# Patient Record
Sex: Male | Born: 1960 | Race: White | Hispanic: No | Smoking: Current every day smoker
Health system: Southern US, Community
[De-identification: ages and names within clinical notes are randomized; demographics above are authoritative.]

## PROBLEM LIST (undated history)

## (undated) DIAGNOSIS — M19019 Primary osteoarthritis, unspecified shoulder: Secondary | ICD-10-CM

## (undated) DIAGNOSIS — M87059 Idiopathic aseptic necrosis of unspecified femur: Secondary | ICD-10-CM

## (undated) DIAGNOSIS — E7800 Pure hypercholesterolemia, unspecified: Secondary | ICD-10-CM

## (undated) DIAGNOSIS — F17200 Nicotine dependence, unspecified, uncomplicated: Secondary | ICD-10-CM

## (undated) DIAGNOSIS — R42 Dizziness and giddiness: Secondary | ICD-10-CM

## (undated) DIAGNOSIS — J189 Pneumonia, unspecified organism: Secondary | ICD-10-CM

## (undated) DIAGNOSIS — S92309A Fracture of unspecified metatarsal bone(s), unspecified foot, initial encounter for closed fracture: Secondary | ICD-10-CM

## (undated) DIAGNOSIS — R972 Elevated prostate specific antigen [PSA]: Secondary | ICD-10-CM

## (undated) HISTORY — DX: Idiopathic aseptic necrosis of unspecified femur: M87.059

## (undated) HISTORY — PX: APPENDECTOMY: SHX54

## (undated) HISTORY — DX: Elevated prostate specific antigen (PSA): R97.20

## (undated) HISTORY — DX: Nicotine dependence, unspecified, uncomplicated: F17.200

## (undated) HISTORY — DX: Pneumonia, unspecified organism: J18.9

## (undated) HISTORY — PX: COLONOSCOPY: SHX174

## (undated) HISTORY — DX: Fracture of unspecified metatarsal bone(s), unspecified foot, initial encounter for closed fracture: S92.309A

## (undated) HISTORY — DX: Pure hypercholesterolemia, unspecified: E78.00

## (undated) HISTORY — PX: CARDIAC CATHETERIZATION: SHX172

## (undated) HISTORY — DX: Primary osteoarthritis, unspecified shoulder: M19.019

## (undated) HISTORY — DX: Dizziness and giddiness: R42

---

## 1999-05-10 ENCOUNTER — Encounter: Payer: Self-pay | Admitting: Emergency Medicine

## 1999-05-10 ENCOUNTER — Emergency Department (HOSPITAL_COMMUNITY): Admission: EM | Admit: 1999-05-10 | Discharge: 1999-05-10 | Payer: Self-pay | Admitting: Emergency Medicine

## 2012-06-06 ENCOUNTER — Ambulatory Visit: Payer: Self-pay | Admitting: Family Medicine

## 2012-06-06 VITALS — BP 112/74 | HR 82 | Temp 97.8°F | Resp 16 | Ht 69.5 in | Wt 202.0 lb

## 2012-06-06 DIAGNOSIS — M79673 Pain in unspecified foot: Secondary | ICD-10-CM

## 2012-06-06 DIAGNOSIS — Z0289 Encounter for other administrative examinations: Secondary | ICD-10-CM

## 2012-06-06 DIAGNOSIS — Z0000 Encounter for general adult medical examination without abnormal findings: Secondary | ICD-10-CM

## 2012-06-06 NOTE — Progress Notes (Signed)
[facility] [address], [place] [address] [phone]  Date:  06/06/2012   Name:  Roger Carrillo   DOB:  Jun 05, 1961   MRN:  [id number]  PCP:  No primary provider on file.    Chief Complaint: Employment Physical and Foot Pain   History of Present Illness:  Roger Carrillo is a 51 y.o. very pleasant male patient who presents with the following:  Here for a DOT exam, and also has noted some foot pain in the sides of both heels for the last couple of weeks.  He thinks this is due to overuse as he is currently moving.  He has no known health problems otherwise except for smoking  There is no problem list on file for this patient.   No past medical history on file.  No past surgical history on file.  History  Substance Use Topics  . Smoking status: Current Everyday Smoker -- 0.8 packs/day for 38 years    Types: Cigarettes  . Smokeless tobacco: Not on file  . Alcohol Use: Not on file    No family history on file.  No Known Allergies  Medication list has been reviewed and updated.  No current outpatient prescriptions on file prior to visit.    Review of Systems:  As per HPI- otherwise negative.   Physical Examination: Filed Vitals:   06/06/12 0810  BP: 112/74  Pulse: 82  Temp: 97.8 F (36.6 C)  Resp: 16   Filed Vitals:   06/06/12 0810  Height: 5' 9.5" (1.765 m)  Weight: 202 lb (91.627 kg)   Body mass index is 29.40 kg/(m^2). Ideal Body Weight: Weight in (lb) to have BMI = 25: 171.4   GEN: WDWN, NAD, Non-toxic, A & O x 3 HEENT: Atraumatic, Normocephalic. Neck supple. No masses, No LAD.  TM and oropharynx wnl, PEERL, EOMI Ears and Nose: No external deformity. CV: RRR, No M/G/R. No JVD. No thrill. No extra heart sounds. PULM: CTA B, no wheezes, crackles, rhonchi. No retractions. No resp. distress. No accessory muscle use. ABD: S, NT, ND, +BS. No rebound. No HSM. EXTR: No c/c/e No inguinal hernia NEURO Normal gait. Normal strength  and DTR.   PSYCH: Normally interactive. Conversant. Not depressed or anxious appearing.  Calm demeanor.  He has mild tenderness in both heels- not over the achilles tendons, but medially and laterally. No swelling or bruise, normal flexion and extension of ankle   Assessment and Plan: 1. Foot pain   2. 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  Foot pain is non- specific.  Likely due to overuse- let me know if this does not get better soon   Abbe Amsterdam, MD

## 2013-04-30 LAB — HM COLONOSCOPY

## 2015-05-16 ENCOUNTER — Emergency Department (HOSPITAL_COMMUNITY): Payer: Self-pay

## 2015-05-16 ENCOUNTER — Emergency Department (HOSPITAL_COMMUNITY)
Admission: EM | Admit: 2015-05-16 | Discharge: 2015-05-16 | Disposition: A | Discharge status: Home / Self Care | Payer: Self-pay | Attending: Emergency Medicine | Admitting: Emergency Medicine

## 2015-05-16 DIAGNOSIS — Z720 Tobacco use: Secondary | ICD-10-CM | POA: Insufficient documentation

## 2015-05-16 DIAGNOSIS — J159 Unspecified bacterial pneumonia: Secondary | ICD-10-CM | POA: Insufficient documentation

## 2015-05-16 DIAGNOSIS — J189 Pneumonia, unspecified organism: Secondary | ICD-10-CM

## 2015-05-16 DIAGNOSIS — R1011 Right upper quadrant pain: Secondary | ICD-10-CM | POA: Insufficient documentation

## 2015-05-16 LAB — URINALYSIS, ROUTINE W REFLEX MICROSCOPIC
Bilirubin Urine: NEGATIVE
Glucose, UA: 100 mg/dL — AB
Hgb urine dipstick: NEGATIVE
Ketones, ur: NEGATIVE mg/dL
Leukocytes, UA: NEGATIVE
Nitrite: NEGATIVE
Protein, ur: NEGATIVE mg/dL
Specific Gravity, Urine: 1.015 (ref 1.005–1.030)
Urobilinogen, UA: 1 mg/dL (ref 0.0–1.0)
pH: 6 (ref 5.0–8.0)

## 2015-05-16 LAB — CBC WITH DIFFERENTIAL/PLATELET
Basophils Absolute: 0 10*3/uL (ref 0.0–0.1)
Basophils Relative: 0 % (ref 0–1)
Eosinophils Absolute: 0.1 10*3/uL (ref 0.0–0.7)
Eosinophils Relative: 1 % (ref 0–5)
HCT: 42 % (ref 39.0–52.0)
Hemoglobin: 14 g/dL (ref 13.0–17.0)
Lymphocytes Relative: 16 % (ref 12–46)
Lymphs Abs: 2.2 10*3/uL (ref 0.7–4.0)
MCH: 29.5 pg (ref 26.0–34.0)
MCHC: 33.3 g/dL (ref 30.0–36.0)
MCV: 88.4 fL (ref 78.0–100.0)
Monocytes Absolute: 1 10*3/uL (ref 0.1–1.0)
Monocytes Relative: 7 % (ref 3–12)
Neutro Abs: 10.1 10*3/uL — ABNORMAL HIGH (ref 1.7–7.7)
Neutrophils Relative %: 76 % (ref 43–77)
Platelets: 287 10*3/uL (ref 150–400)
RBC: 4.75 MIL/uL (ref 4.22–5.81)
RDW: 14.1 % (ref 11.5–15.5)
WBC: 13.3 10*3/uL — ABNORMAL HIGH (ref 4.0–10.5)

## 2015-05-16 LAB — I-STAT TROPONIN, ED: Troponin i, poc: 0 ng/mL (ref 0.00–0.08)

## 2015-05-16 LAB — COMPREHENSIVE METABOLIC PANEL
ALT: 19 U/L (ref 17–63)
AST: 29 U/L (ref 15–41)
Albumin: 3.9 g/dL (ref 3.5–5.0)
Alkaline Phosphatase: 68 U/L (ref 38–126)
Anion gap: 14 (ref 5–15)
BUN: 12 mg/dL (ref 6–20)
CO2: 19 mmol/L — ABNORMAL LOW (ref 22–32)
Calcium: 9.2 mg/dL (ref 8.9–10.3)
Chloride: 101 mmol/L (ref 101–111)
Creatinine, Ser: 1.35 mg/dL — ABNORMAL HIGH (ref 0.61–1.24)
GFR calc Af Amer: >60 mL/min (ref >60)
GFR calc non Af Amer: 58 mL/min — ABNORMAL LOW (ref >60)
Glucose, Bld: 89 mg/dL (ref 65–99)
Potassium: 4.1 mmol/L (ref 3.5–5.1)
Sodium: 134 mmol/L — ABNORMAL LOW (ref 135–145)
Total Bilirubin: 1.3 mg/dL — ABNORMAL HIGH (ref 0.3–1.2)
Total Protein: 7.7 g/dL (ref 6.5–8.1)

## 2015-05-16 LAB — CBG MONITORING, ED: Glucose-Capillary: 102 mg/dL — ABNORMAL HIGH (ref 65–99)

## 2015-05-16 MED ORDER — DOXYCYCLINE HYCLATE 100 MG PO TABS
100.0000 mg | ORAL_TABLET | Freq: Once | ORAL | Status: AC
  Administered 2015-05-16: 100 mg via ORAL
  Filled 2015-05-16: qty 1

## 2015-05-16 MED ORDER — DOXYCYCLINE HYCLATE 100 MG PO CAPS: 100.0000 mg | ORAL_CAPSULE | Freq: Two times a day (BID) | ORAL | Status: DC

## 2015-05-16 MED ORDER — SODIUM CHLORIDE 0.9 % IV BOLUS (SEPSIS)
1000.0000 mL | Freq: Once | INTRAVENOUS | Status: AC
  Administered 2015-05-16: 1000 mL via INTRAVENOUS

## 2015-05-16 MED ORDER — ACETAMINOPHEN 325 MG PO TABS
650.0000 mg | ORAL_TABLET | Freq: Once | ORAL | Status: AC
  Administered 2015-05-16: 650 mg via ORAL
  Filled 2015-05-16: qty 2

## 2015-05-16 NOTE — ED Notes (Signed)
Pt left with all belongings and wheeled out of treatment area.

## 2015-05-16 NOTE — Discharge Instructions (Signed)
Pneumonia, Adult  Pneumonia is an infection of the lungs. It may be caused by a germ (virus or bacteria). Some types of pneumonia can spread easily from person to person. This can happen when you cough or sneeze.  HOME CARE   Only take medicine as told by your doctor.   Take your medicine (antibiotics) as told. Finish it even if you start to feel better.   Do not smoke.   You may use a vaporizer or humidifier in your room. This can help loosen thick spit (mucus).   Sleep so you are almost sitting up (semi-upright). This helps reduce coughing.   Rest.  A shot (vaccine) can help prevent pneumonia. Shots are often advised for:   People over 65 years old.   Patients on chemotherapy.   People with long-term (chronic) lung problems.   People with immune system problems.  GET HELP RIGHT AWAY IF:    You are getting worse.   You cannot control your cough, and you are losing sleep.   You cough up blood.   Your pain gets worse, even with medicine.   You have a fever.   Any of your problems are getting worse, not better.   You have shortness of breath or chest pain.  MAKE SURE YOU:    Understand these instructions.   Will watch your condition.   Will get help right away if you are not doing well or get worse.  Document Released: 03/04/2008 Document Revised: 12/09/2011 Document Reviewed: 12/07/2010  ExitCare Patient Information 2015 [employer]. This information is not intended to replace advice given to you by your health care provider. Make sure you discuss any questions you have with your health care provider.

## 2015-05-16 NOTE — ED Notes (Signed)
Pt reports he left work early yesterday due to feeling weak. This morning he urinated orange colored urine. Back, legs, necks. Says been feeling very fatigued. Tick bite 3 weeks ago. Blurred vision and chest pain. States he just has no strength especially in legs.

## 2015-05-16 NOTE — ED Provider Notes (Signed)
History   Chief Complaint  Patient presents with  . Fatigue    HPI  Roger Carrillo is a 54 y.o. male with no reported PMH who presents to ED with c/o fatigue, subj f/c, arthralgia/myalgias, RUQ pain, prod cough, HA, rash.   Onset of symptoms: gradual. Duration 4 days.  Modifying factors none.  Severity: moderate.  Associated symptoms: as above.  Hx of similar symptoms: no.    Pt reports sxs prog worsening. Also had tick bite 3 months ago. Rash is red, not itchy/painful, looks like sunburn although not out in sun and is now improving. RUQ pain is achy and worse with palpation, rated mild; no h/o prior; has GB. Also has had 1 day of orange urine, no dysuria/freq/flank pain. Denies CP, SOB, N/V/D, dizziness, focal weakness.   Past medical/surgical history, social history, medications, allergies and FH have been reviewed with patient and/or in documentation. Furthermore, if pt family or friend(s) present, additional historical information was obtained from them.  No past medical history on file. No past surgical history on file. No family history on file. Social History  Substance Use Topics  . Smoking status: Current Every Day Smoker -- 0.80 packs/day for 38 years    Types: Cigarettes  . Smokeless tobacco: Not on file  . Alcohol Use: Not on file     Review of Systems Constitutional: + F/C, +fatigue.  HENT: - congestion, -rhinorrhea, -sore throat.   Eyes: - eye pain, -visual disturbance.  Respiratory: + cough, -SOB, -hemoptysis.   Cardiovascular: - CP, -palps.  Gastrointestinal: - N/V/D, +abd pain  Genitourinary: - flank pain, -dysuria, -frequency. + orange urine Musculoskeletal: + myalgia/arthritis, -joint swelling, -gait abnormality, -back pain  Skin: - rash/lesion.  Neurological: - focal weakness, -lightheadedness, -dizziness, -numbness, +HA.  All other systems reviewed and are negative.   Physical Exam  Physical Exam  ED Triage Vitals  Enc Vitals Group     BP  05/16/15 1256 115/82 mmHg     Pulse Rate 05/16/15 1256 106     Resp 05/16/15 1256 18     Temp 05/16/15 1256 99.1 F (37.3 C)     Temp Source 05/16/15 1256 Oral     SpO2 05/16/15 1256 95 %     Weight --      Height --      Head Cir --      Peak Flow --      Pain Score 05/16/15 1254 7     Pain Loc --      Pain Edu? --      Excl. in GC? --    Constitutional: Patient is well appearing and in no acute distress Head: Normocephalic and atraumatic.  Eyes: Extraocular motion intact, no scleral icterus Mouth: MMM, OP clear Neck: Supple without meningismus, mass, or overt JVD Respiratory: No respiratory distress. Normal WOB. No w/r/g. CV: tachycardic rate, no obvious murmurs.  Pulses +2 and symmetric. Euvolemic Abdomen: Soft, ND, no r/g. TTP in RUQ and + murphey sign. No mass.  MSK: Extremities are atraumatic without deformity, ROM intact Skin: Warm, dry, intact with fine erythematous macular rash to trunk without petechiae or purpura and appears like sunburn. No drainage/fluctuance/induration or TTP. Neuro: HDS, AAOx4. PERRL, EOMI, TML, face sym. CN 2-12 grossly intact. 5/5 sym, no drift, SILT, normal gait and coordination.   ED Course  Procedures   Labs Reviewed  CBC WITH DIFFERENTIAL/PLATELET - Abnormal; Notable for the following:    WBC 13.3 (*)    Neutro Abs  10.1 (*)    All other components within normal limits  COMPREHENSIVE METABOLIC PANEL - Abnormal; Notable for the following:    Sodium 134 (*)    CO2 19 (*)    Creatinine, Ser 1.35 (*)    Total Bilirubin 1.3 (*)    GFR calc non Af Amer 58 (*)    All other components within normal limits  URINALYSIS, ROUTINE W REFLEX MICROSCOPIC (NOT AT [facility]) - Abnormal; Notable for the following:    Glucose, UA 100 (*)    All other components within normal limits  CBG MONITORING, ED - Abnormal; Notable for the following:    Glucose-Capillary 102 (*)    All other components within normal limits  I-STAT TROPOININ, ED   I personally  reviewed and interpreted all labs.  Dg Chest 2 View  05/16/2015   CLINICAL DATA:  Four day history of cough, fever and generalized weakness. Tick bite approximately 3 months ago with persistence of the skin lesion on his leg.  EXAM: CHEST  2 VIEW  COMPARISON:  None.  FINDINGS: Cardiomediastinal silhouette unremarkable. Mildly prominent bronchovascular markings diffusely and mild central peribronchial thickening. Patchy and streaky opacities in the perihilar regions and the lower lobes bilaterally. No pleural effusions. No pneumothorax. Mild degenerative changes involving the thoracic spine.  IMPRESSION: Bronchopneumonia suspected in both lungs involving the upper and lower lobes.   Electronically Signed   By: Hulan Saas M.D.   On: 05/16/2015 17:43   US Abdomen Limited Ruq  05/16/2015   CLINICAL DATA:  Acute onset of right upper quadrant abdominal pain. Initial encounter.  EXAM: US ABDOMEN LIMITED - RIGHT UPPER QUADRANT  COMPARISON:  None.  FINDINGS: Gallbladder:  No gallstones or wall thickening visualized. No sonographic Murphy sign noted.  Common bile duct:  Diameter: 0.2 cm, within normal limits in caliber.  Liver:  No focal lesion identified. Diffusely increased parenchymal echogenicity likely reflects fatty infiltration.  IMPRESSION: 1. No acute abnormality seen at the right upper quadrant. 2. Diffuse fatty infiltration within the liver.   Electronically Signed   By: Roanna Raider M.D.   On: 05/16/2015 17:57   I personally viewed above image(s) which were used in my medical decision making. Formal interpretations by Radiology.   EKG Interpretation  Date/Time:  Tuesday May 16 2015 12:53:35 EDT Ventricular Rate:  101 PR Interval:  156 QRS Duration: 90 QT Interval:  326 QTC Calculation: 422 R Axis:   18 Text Interpretation:  Sinus tachycardia Otherwise normal ECG No old tracing to compare Confirmed by Mirian Mo [phone]) on 05/16/2015 4:51:01 PM       MDM: Roger Carrillo is a  54 y.o. male with H&P as above who p/w CC: fatigue + multiple other vague complaints as above.  On arrival, patient is hemodynamically stable and in no apparent distress. Patient's clinical picture raises concern for viral process versus tickborne illness. Patient does not appear septic. Patient additionally has a positive Murphy sign on exam as well as a rash as described above in the setting of multiple other vague symptoms. Given his recent tick bite and arthralgia, myalgia with rash, I have low threshold for starting patient on doxycycline. I have ordered a chest x-ray given his complaint of cough to rule out pneumonia. Ultrasound to evaluate gallbladder.  Workup was notable for slight hyponatremia with normal platelet count. Patient has a mild leukocytosis. EKG is sinus tach without signs of acute ischemia. Patient is given IV fluids as well as Tylenol for mild  headache. Patient has no meningeal signs on exam and has a benign neuro exam as above. No indication for LP at this time. RUQ scan unremarkable  CXR shows PNA. First dose ABX (doxy) given. Doxy was chosen as it will also cover for possible tick borne illness given rash and tick exposure. Pt remained stable in ED and is deemed stable for d/c.  Old records reviewed (if available). Labs and imaging reviewed personally by myself and considered in medical decision making if ordered. Clinical Impression: 1. CAP (community acquired pneumonia)   2. RUQ pain     Disposition: Discharge  Condition: Good  I have discussed the results, Dx and Tx plan with the pt(& family if present). He/she/they expressed understanding and agree(s) with the plan. Discharge instructions discussed at great length. Strict return precautions discussed and pt &/or family have verbalized understanding of the instructions. No further questions at time of discharge.    Discharge Medication List as of 05/16/2015  6:52 PM    START taking these medications   Details   doxycycline (VIBRAMYCIN) 100 MG capsule Take 1 capsule (100 mg total) by mouth 2 (two) times daily., Starting 05/16/2015, Until Discontinued, Print        Follow Up: [facility]     [address] [place] [address] [phone]  As needed  [facility] EMERGENCY DEPARTMENT [address] [id number] Wilhemina Bonito [place] [address] [phone]  If symptoms worsen   Pt seen in conjunction with Dr. Jomarie Longs, DO [facility] Emergency Medicine Resident - PGY-3     Ames Dura, MD 05/17/15 2536  Mirian Mo, MD 05/21/15 [phone]

## 2015-06-19 ENCOUNTER — Emergency Department (HOSPITAL_BASED_OUTPATIENT_CLINIC_OR_DEPARTMENT_OTHER): Payer: BLUE CROSS/BLUE SHIELD

## 2015-06-19 ENCOUNTER — Emergency Department (HOSPITAL_BASED_OUTPATIENT_CLINIC_OR_DEPARTMENT_OTHER)
Admission: EM | Admit: 2015-06-19 | Discharge: 2015-06-19 | Disposition: A | Discharge status: Home / Self Care | Payer: BLUE CROSS/BLUE SHIELD | Attending: Emergency Medicine | Admitting: Emergency Medicine

## 2015-06-19 ENCOUNTER — Encounter (HOSPITAL_BASED_OUTPATIENT_CLINIC_OR_DEPARTMENT_OTHER): Payer: Self-pay | Admitting: Emergency Medicine

## 2015-06-19 DIAGNOSIS — T07XXXA Unspecified multiple injuries, initial encounter: Secondary | ICD-10-CM

## 2015-06-19 DIAGNOSIS — S92351A Displaced fracture of fifth metatarsal bone, right foot, initial encounter for closed fracture: Secondary | ICD-10-CM | POA: Diagnosis not present

## 2015-06-19 DIAGNOSIS — S3991XA Unspecified injury of abdomen, initial encounter: Secondary | ICD-10-CM | POA: Insufficient documentation

## 2015-06-19 DIAGNOSIS — Y92410 Unspecified street and highway as the place of occurrence of the external cause: Secondary | ICD-10-CM | POA: Diagnosis not present

## 2015-06-19 DIAGNOSIS — Y998 Other external cause status: Secondary | ICD-10-CM | POA: Insufficient documentation

## 2015-06-19 DIAGNOSIS — Y9389 Activity, other specified: Secondary | ICD-10-CM | POA: Insufficient documentation

## 2015-06-19 DIAGNOSIS — S92321A Displaced fracture of second metatarsal bone, right foot, initial encounter for closed fracture: Secondary | ICD-10-CM | POA: Insufficient documentation

## 2015-06-19 DIAGNOSIS — Z720 Tobacco use: Secondary | ICD-10-CM | POA: Insufficient documentation

## 2015-06-19 DIAGNOSIS — S92301A Fracture of unspecified metatarsal bone(s), right foot, initial encounter for closed fracture: Secondary | ICD-10-CM

## 2015-06-19 DIAGNOSIS — Z23 Encounter for immunization: Secondary | ICD-10-CM | POA: Insufficient documentation

## 2015-06-19 DIAGNOSIS — S99921A Unspecified injury of right foot, initial encounter: Secondary | ICD-10-CM | POA: Diagnosis present

## 2015-06-19 DIAGNOSIS — S60221A Contusion of right hand, initial encounter: Secondary | ICD-10-CM | POA: Diagnosis not present

## 2015-06-19 DIAGNOSIS — Z792 Long term (current) use of antibiotics: Secondary | ICD-10-CM | POA: Insufficient documentation

## 2015-06-19 DIAGNOSIS — S9031XA Contusion of right foot, initial encounter: Secondary | ICD-10-CM | POA: Diagnosis not present

## 2015-06-19 MED ORDER — SILVER SULFADIAZINE 1 % EX CREA: 1.0000 "application " | TOPICAL_CREAM | Freq: Every day | CUTANEOUS | Status: DC

## 2015-06-19 MED ORDER — OXYCODONE-ACETAMINOPHEN 5-325 MG PO TABS
2.0000 | ORAL_TABLET | Freq: Once | ORAL | Status: AC
  Administered 2015-06-19: 2 via ORAL
  Filled 2015-06-19: qty 2

## 2015-06-19 MED ORDER — SILVER SULFADIAZINE 1 % EX CREA
TOPICAL_CREAM | Freq: Once | CUTANEOUS | Status: AC
  Administered 2015-06-19: 1 via TOPICAL
  Filled 2015-06-19: qty 85

## 2015-06-19 MED ORDER — HYDROCODONE-ACETAMINOPHEN 5-325 MG PO TABS: 1.0000 | ORAL_TABLET | ORAL | Status: DC | PRN

## 2015-06-19 MED ORDER — TETANUS-DIPHTH-ACELL PERTUSSIS 5-2.5-18.5 LF-MCG/0.5 IM SUSP
0.5000 mL | Freq: Once | INTRAMUSCULAR | Status: AC
  Administered 2015-06-19: 0.5 mL via INTRAMUSCULAR
  Filled 2015-06-19: qty 0.5

## 2015-06-19 NOTE — Discharge Instructions (Signed)
Abrasion  An abrasion is a cut or scrape of the skin. Abrasions do not extend through all layers of the skin and most heal within 10 days. It is important to care for your abrasion properly to prevent infection.  CAUSES   Most abrasions are caused by falling on, or gliding across, the ground or other surface. When your skin rubs on something, the outer and inner layer of skin rubs off, causing an abrasion.  DIAGNOSIS   Your caregiver will be able to diagnose an abrasion during a physical exam.   TREATMENT   Your treatment depends on how large and deep the abrasion is. Generally, your abrasion will be cleaned with water and a mild soap to remove any dirt or debris. An antibiotic ointment may be put over the abrasion to prevent an infection. A bandage (dressing) may be wrapped around the abrasion to keep it from getting dirty.   You may need a tetanus shot if:   You cannot remember when you had your last tetanus shot.   You have never had a tetanus shot.   The injury broke your skin.  If you get a tetanus shot, your arm may swell, get red, and feel warm to the touch. This is common and not a problem. If you need a tetanus shot and you choose not to have one, there is a rare chance of getting tetanus. Sickness from tetanus can be serious.   HOME CARE INSTRUCTIONS    If a dressing was applied, change it at least once a day or as directed by your caregiver. If the bandage sticks, soak it off with warm water.    Wash the area with water and a mild soap to remove all the ointment 2 times a day. Rinse off the soap and pat the area dry with a clean towel.    Reapply any ointment as directed by your caregiver. This will help prevent infection and keep the bandage from sticking. Use gauze over the wound and under the dressing to help keep the bandage from sticking.    Change your dressing right away if it becomes wet or dirty.    Only take over-the-counter or prescription medicines for pain, discomfort, or fever as  directed by your caregiver.    Follow up with your caregiver within 24-48 hours for a wound check, or as directed. If you were not given a wound-check appointment, look closely at your abrasion for redness, swelling, or pus. These are signs of infection.  SEEK IMMEDIATE MEDICAL CARE IF:    You have increasing pain in the wound.    You have redness, swelling, or tenderness around the wound.    You have pus coming from the wound.    You have a fever or persistent symptoms for more than 2-3 days.   You have a fever and your symptoms suddenly get worse.   You have a bad smell coming from the wound or dressing.   MAKE SURE YOU:    Understand these instructions.   Will watch your condition.   Will get help right away if you are not doing well or get worse.  Document Released: 06/26/2005 Document Revised: 09/02/2012 Document Reviewed: 08/20/2011  Exit[employer] Patient Information 2015 [employer]. This information is not intended to replace advice given to you by your health care provider. Make sure you discuss any questions you have with your health care provider.  Metatarsal Fracture, Undisplaced  A metatarsal fracture is a break in the bone(s)   there are problems in the forefoot and x-rays are normal a later bone scan will usually make the diagnosis.  TREATMENT AND HOME CARE INSTRUCTIONS  Treatment may or may not include a cast or walking shoe. When casts are needed the use is usually for short periods of time so as not to slow down healing with muscle wasting (atrophy).  Activities should be stopped until further advised by your caregiver.  Wear shoes with adequate shock absorbing capabilities and stiff soles.  Alternative exercise may be undertaken while waiting for healing. These may include bicycling and swimming, or as  your caregiver suggests.  It is important to keep all follow-up visits or specialty referrals. The failure to keep these appointments could result in improper bone healing and chronic pain or disability.  Warning: Do not drive a car or operate a motor vehicle until your caregiver specifically tells you it is safe to do so. IF YOU DO NOT HAVE A CAST OR SPLINT:  You may walk on your injured foot as tolerated or advised.  Do not put any weight on your injured foot for as long as directed by your caregiver. Slowly increase the amount of time you walk on the foot as the pain allows or as advised.  Use crutches until you can bear weight without pain. A gradual increase in weight bearing may help.  Apply ice to the injury for 15-20 minutes each hour while awake for the first 2 days. Put the ice in a plastic bag and place a towel between the bag of ice and your skin.  Only take over-the-counter or prescription medicines for pain, discomfort, or fever as directed by your caregiver. SEEK IMMEDIATE MEDICAL CARE IF:   Your cast gets damaged or breaks.  You have continued severe pain or more swelling than you did before the cast was put on, or the pain is not controlled with medications.  Your skin or nails below the injury turn blue or grey, or feel cold or numb.  There is a bad smell, or new stains or pus-like (purulent) drainage coming from the cast. MAKE SURE YOU:   Understand these instructions.  Will watch your condition.  Will get help right away if you are not doing well or get worse. Document Released: 06/08/2002 Document Revised: 12/09/2011 Document Reviewed: 04/29/2008 [facility] Patient Information 2015 [place], [place]. This information is not intended to replace advice given to you by your health care provider. Make sure you discuss any questions you have with your health care provider.
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
## 2018-05-13 ENCOUNTER — Emergency Department (HOSPITAL_COMMUNITY): Payer: Self-pay

## 2018-05-13 ENCOUNTER — Emergency Department (HOSPITAL_COMMUNITY)
Admission: EM | Admit: 2018-05-13 | Discharge: 2018-05-13 | Disposition: A | Discharge status: Home / Self Care | Payer: Self-pay | Attending: Emergency Medicine | Admitting: Emergency Medicine

## 2018-05-13 ENCOUNTER — Encounter (HOSPITAL_COMMUNITY): Payer: Self-pay

## 2018-05-13 ENCOUNTER — Other Ambulatory Visit: Payer: Self-pay

## 2018-05-13 DIAGNOSIS — F17210 Nicotine dependence, cigarettes, uncomplicated: Secondary | ICD-10-CM | POA: Insufficient documentation

## 2018-05-13 DIAGNOSIS — R42 Dizziness and giddiness: Secondary | ICD-10-CM | POA: Insufficient documentation

## 2018-05-13 DIAGNOSIS — J019 Acute sinusitis, unspecified: Secondary | ICD-10-CM | POA: Insufficient documentation

## 2018-05-13 LAB — CBC WITH DIFFERENTIAL/PLATELET
BASOS ABS: 0.1 10*3/uL (ref 0.0–0.1)
BASOS PCT: 1 %
EOS ABS: 0.2 10*3/uL (ref 0.0–0.7)
EOS PCT: 2 %
HCT: 45.9 % (ref 39.0–52.0)
Hemoglobin: 15.4 g/dL (ref 13.0–17.0)
Lymphocytes Relative: 32 %
Lymphs Abs: 3 10*3/uL (ref 0.7–4.0)
MCH: 29.3 pg (ref 26.0–34.0)
MCHC: 33.6 g/dL (ref 30.0–36.0)
MCV: 87.4 fL (ref 78.0–100.0)
Monocytes Absolute: 0.8 10*3/uL (ref 0.1–1.0)
Monocytes Relative: 8 %
Neutro Abs: 5.3 10*3/uL (ref 1.7–7.7)
Neutrophils Relative %: 57 %
PLATELETS: 273 10*3/uL (ref 150–400)
RBC: 5.25 MIL/uL (ref 4.22–5.81)
RDW: 14.7 % (ref 11.5–15.5)
WBC: 9.2 10*3/uL (ref 4.0–10.5)

## 2018-05-13 LAB — URINALYSIS, ROUTINE W REFLEX MICROSCOPIC
BILIRUBIN URINE: NEGATIVE
Glucose, UA: NEGATIVE mg/dL
Hgb urine dipstick: NEGATIVE
Ketones, ur: NEGATIVE mg/dL
Leukocytes, UA: NEGATIVE
Nitrite: NEGATIVE
PH: 5 (ref 5.0–8.0)
Protein, ur: NEGATIVE mg/dL
SPECIFIC GRAVITY, URINE: 1.005 (ref 1.005–1.030)

## 2018-05-13 LAB — COMPREHENSIVE METABOLIC PANEL
ALBUMIN: 4 g/dL (ref 3.5–5.0)
ALK PHOS: 54 U/L (ref 38–126)
ALT: 16 U/L (ref 0–44)
AST: 17 U/L (ref 15–41)
Anion gap: 11 (ref 5–15)
BUN: 15 mg/dL (ref 6–20)
CALCIUM: 8.8 mg/dL — AB (ref 8.9–10.3)
CHLORIDE: 104 mmol/L (ref 98–111)
CO2: 21 mmol/L — AB (ref 22–32)
Creatinine, Ser: 1 mg/dL (ref 0.61–1.24)
GFR calc Af Amer: >60 mL/min (ref >60)
GFR calc non Af Amer: >60 mL/min (ref >60)
GLUCOSE: 136 mg/dL — AB (ref 70–99)
Potassium: 3.9 mmol/L (ref 3.5–5.1)
SODIUM: 136 mmol/L (ref 135–145)
Total Bilirubin: 0.6 mg/dL (ref 0.3–1.2)
Total Protein: 7.5 g/dL (ref 6.5–8.1)

## 2018-05-13 LAB — TROPONIN I: Troponin I: <0.03 ng/mL (ref <0.03)

## 2018-05-13 MED ORDER — ACETAMINOPHEN 500 MG PO TABS
1000.0000 mg | ORAL_TABLET | Freq: Once | ORAL | Status: AC
  Administered 2018-05-13: 1000 mg via ORAL
  Filled 2018-05-13: qty 2

## 2018-05-13 MED ORDER — MECLIZINE HCL 25 MG PO TABS: 25.0000 mg | ORAL_TABLET | Freq: Three times a day (TID) | ORAL | 0 refills | Status: DC | PRN

## 2018-05-13 MED ORDER — AMOXICILLIN-POT CLAVULANATE 875-125 MG PO TABS: 1.0000 | ORAL_TABLET | Freq: Two times a day (BID) | ORAL | 0 refills | Status: DC

## 2018-05-13 NOTE — ED Triage Notes (Signed)
Pt reports had sudden onset of dizziness, lightheadedness, and feeling hot and diaphoretic last night while he was sitting on his porch. PT says he went inside and symptoms eventually improved but came back when he woke up this morning at 0445.  Pt says he has a "funny feeling" in his chest.  Pt says he can't describe it.

## 2018-05-13 NOTE — ED Notes (Signed)
Pt signed esig.  It did not take and patient walked out. Could not obtain a new signature

## 2018-05-13 NOTE — ED Provider Notes (Signed)
Emergency Department Provider Note   I have reviewed the triage vital signs and the nursing notes.   HISTORY  Chief Complaint Dizziness   HPI Roger Carrillo is a 57 y.o. male smoker with unknown past medical history the presents to the emergency department today secondary to not feeling right.  Patient states that he has been under a lot of stress recently with a recent wife leaving him and not pain is taxes in his wages getting garnished.  However he is been dealing with this pretty well last night he was sitting on the porch swing and he had a sudden onset of dizziness.  He tried to stand up and he says that he felt like he is in a pass out in the left side of his body felt "weird".  He sat back down and it improved some however when he went to sit in his chair and side.  After approximately 10 to 50 minutes of sitting there he had acute onset of diaphoresis with left-sided chest and arm funny feeling.  This improved some however this morning it was still present.  He got his truck and it got worse again so he had his boss bring him here for further evaluation.  Patient does not know any of his family history as far as mother father brothers or sisters.  He knows his grandfather had a heart attack in his sleep and that is it.  Not go to the doctor but has had a recent normal colonoscopy and had a checkup a few years ago that was normal.  No drugs besides occasional marijuana use.  Also has occasional alcohol use but last time was a few days ago.  No recent illnesses, fever, cough, shortness of breath, nausea, vomiting, diarrhea, constipation or urinary symptoms. No other associated or modifying symptoms.    History reviewed. No pertinent past medical history.  There are no active problems to display for this patient.   Past Surgical History:  Procedure Laterality Date  . APPENDECTOMY    . CARDIAC CATHETERIZATION      Current Outpatient Rx  . Order #: [id number] Class: Historical Med    . Order #: [id number] Class: Historical Med  . Order #: [id number] Class: Print  . Order #: [id number] Class: Print    Allergies Patient has no known allergies.  No family history on file.  Social History Social History   Tobacco Use  . Smoking status: Current Every Day Smoker    Packs/day: 0.80    Years: 38.00    Pack years: 30.40    Types: Cigarettes  . Smokeless tobacco: Never Used  Substance Use Topics  . Alcohol use: Yes    Comment: occ  . Drug use: No    Review of Systems  All other systems negative except as documented in the HPI. All pertinent positives and negatives as reviewed in the HPI. ____________________________________________   PHYSICAL EXAM:  VITAL SIGNS: ED Triage Vitals  Enc Vitals Group     BP 05/13/18 0803 (!) 149/98     Pulse Rate 05/13/18 0803 83     Resp 05/13/18 0803 20     Temp --      Temp src --      SpO2 05/13/18 0803 100 %     Weight 05/13/18 0759 208 lb (94.3 kg)     Height 05/13/18 0759 5\' 11"  (1.803 m)     Head Circumference --      Peak Flow --  Pain Score --      Pain Loc --      Pain Edu? --      Excl. in GC? --     Constitutional: Alert and oriented. Well appearing and in no acute distress. Eyes: Conjunctivae are normal. PERRL. EOMI. Head: Atraumatic. Nose: No congestion/rhinnorhea. Mouth/Throat: Mucous membranes are moist.  Oropharynx non-erythematous. Neck: No stridor.  No meningeal signs.   Cardiovascular: Normal rate, regular rhythm. Good peripheral circulation. Grossly normal heart sounds.   Respiratory: Normal respiratory effort.  No retractions. Lungs CTAB but diminished in left chest. Gastrointestinal: Soft and nontender. No distention.  Musculoskeletal: No lower extremity tenderness nor edema. No gross deformities of extremities. Neurologic:  Normal speech and language. No gross focal neurologic deficits are appreciated. Diminished sensation on left extremity and lower left face. Possibly mild left facial  droop. Normal strength in left arm and leg, no drift. CN's otherwise intact.  Skin:  Skin is warm, dry and intact. No rash noted.  ____________________________________________   LABS (all labs ordered are listed, but only abnormal results are displayed)  Labs Reviewed  COMPREHENSIVE METABOLIC PANEL - Abnormal; Notable for the following components:      Result Value   CO2 21 (*)    Glucose, Bld 136 (*)    Calcium 8.8 (*)    All other components within normal limits  URINALYSIS, ROUTINE W REFLEX MICROSCOPIC - Abnormal; Notable for the following components:   Color, Urine STRAW (*)    All other components within normal limits  CBC WITH DIFFERENTIAL/PLATELET  TROPONIN I  TROPONIN I   ____________________________________________  EKG   EKG Interpretation  Date/Time:  Wednesday May 13 2018 07:58:46 EDT Ventricular Rate:  77 PR Interval:    QRS Duration: 94 QT Interval:  368 QTC Calculation: 417 R Axis:   23 Text Interpretation:  Sinus rhythm No significant change since last tracing Confirmed by Marily MemosMesner, Oreoluwa Aigner [id number]) on 05/13/2018 8:26:19 AM       ____________________________________________  RADIOLOGY  Dg Chest 2 View  Result Date: 05/13/2018 CLINICAL DATA:  Diaphoresis and dizziness EXAM: CHEST - 2 VIEW COMPARISON:  May 16, 2015 FINDINGS: There is no edema or consolidation. The heart size and pulmonary vascularity are normal. No adenopathy. There is degenerative change in the thoracic spine. IMPRESSION: No edema or consolidation. Electronically Signed   By: Bretta BangWilliam  Woodruff III M.D.   On: 05/13/2018 08:49   Ct Head Wo Contrast  Result Date: 05/13/2018 CLINICAL DATA:  Dizziness EXAM: CT HEAD WITHOUT CONTRAST TECHNIQUE: Contiguous axial images were obtained from the base of the skull through the vertex without intravenous contrast. COMPARISON:  None. FINDINGS: Brain: The ventricles are normal in size and configuration. There is no intracranial mass, hemorrhage,  extra-axial fluid collection, or midline shift. Gray-white compartments appear normal. No evident acute infarct. Vascular: No appreciable hyperdense vessel. There is calcification in each carotid siphon, slightly more on the left than on the right. Skull: Bony calvarium appears intact. Sinuses/Orbits: There is opacification in several ethmoid air cells bilaterally. There is mucosal thickening in the inferior right frontal sinus. Other visualized paranasal sinuses are clear. Orbits appear symmetric bilaterally. Other: Visualized mastoid air cells are clear. IMPRESSION: Areas of paranasal sinus disease. Foci of arterial vascular calcification noted. No mass or hemorrhage. No evident acute infarct. Electronically Signed   By: Bretta BangWilliam  Woodruff III M.D.   On: 05/13/2018 08:52   Mr Brain Wo Contrast  Result Date: 05/13/2018 CLINICAL DATA:  Focal neuro deficit.  Sudden  onset of dizziness EXAM: MRI HEAD WITHOUT CONTRAST TECHNIQUE: Multiplanar, multiecho pulse sequences of the brain and surrounding structures were obtained without intravenous contrast. COMPARISON:  Head CT earlier today FINDINGS: Brain: No acute infarction, hemorrhage, hydrocephalus, extra-axial collection or mass lesion. Small FLAIR hyperintensity and left periatrial white matter from nonspecific remote insult, commonly seen by the patient's age. Vascular: Major flow voids and vascular enhancements are preserved. Skull and upper cervical spine: No evidence of marrow lesion Sinuses/Orbits: Clear mastoid and middle ear spaces. Mild mucosal thickening in the right inferior frontal and bilateral ethmoid sinuses. Presumed retention cyst in the inferior left maxillary sinus. IMPRESSION: 1. No acute finding.  Negative for posterior circulation infarct. 2. Mild sinusitis. Electronically Signed   By: Marnee SpringJonathon  Watts M.D.   On: 05/13/2018 10:42    ____________________________________________   PROCEDURES  Procedure(s) performed:    Procedures   ____________________________________________   INITIAL IMPRESSION / ASSESSMENT AND PLAN / ED COURSE  With his sensory changes concern for possible CVA.  Also with diaphoresis and is odd feelings in the left would also consider cardiac cause.  Will evaluate for same.  Negative for any emergent causes for his symptoms.  Patient's symptoms significantly improved in the emergency room.  Unclear etiology at this time however he does have evidence of sinusitis on his CT scan so we will treat for that.  Patient stable for discharge with PCP and neurology follow-up.   Pertinent labs & imaging results that were available during my care of the patient were reviewed by me and considered in my medical decision making (see chart for details).  ____________________________________________  FINAL CLINICAL IMPRESSION(S) / ED DIAGNOSES  Final diagnoses:  Dizziness     MEDICATIONS GIVEN DURING THIS VISIT:  Medications  acetaminophen (TYLENOL) tablet 1,000 mg (1,000 mg Oral Given 05/13/18 0919)     NEW OUTPATIENT MEDICATIONS STARTED DURING THIS VISIT:  Discharge Medication List as of 05/13/2018 12:14 PM    START taking these medications   Details  amoxicillin-clavulanate (AUGMENTIN) 875-125 MG tablet Take 1 tablet by mouth 2 (two) times daily. One po bid x 7 days, Starting Wed 05/13/2018, Print    meclizine (ANTIVERT) 25 MG tablet Take 1 tablet (25 mg total) by mouth 3 (three) times daily as needed for dizziness., Starting Wed 05/13/2018, Normal        Note:  This note was prepared with assistance of Dragon voice recognition software. Occasional wrong-word or sound-a-like substitutions may have occurred due to the inherent limitations of voice recognition software.   Marily MemosMesner, Laiken Sandy, MD 05/13/18 [phone]

## 2018-05-13 NOTE — ED Notes (Signed)
Patient ambulated in hall.  Patient stated he had a couple of seconds of dizziness upon standing, but was able to walk without assistance.  He stated the dizziness cleared up after he stood for a couple of seconds, which was much better than yesterday.

## 2018-05-13 NOTE — ED Notes (Signed)
Pt taken to xray 

## 2018-06-09 ENCOUNTER — Ambulatory Visit (INDEPENDENT_AMBULATORY_CARE_PROVIDER_SITE_OTHER): Payer: Self-pay | Admitting: Family Medicine

## 2018-06-09 ENCOUNTER — Encounter: Payer: Self-pay | Admitting: Family Medicine

## 2018-06-09 ENCOUNTER — Other Ambulatory Visit: Payer: Self-pay

## 2018-06-09 VITALS — BP 125/87 | HR 83 | Temp 97.7°F | Resp 16 | Ht 70.5 in | Wt 207.0 lb

## 2018-06-09 DIAGNOSIS — H9193 Unspecified hearing loss, bilateral: Secondary | ICD-10-CM

## 2018-06-09 DIAGNOSIS — H9313 Tinnitus, bilateral: Secondary | ICD-10-CM

## 2018-06-09 DIAGNOSIS — R42 Dizziness and giddiness: Secondary | ICD-10-CM

## 2018-06-09 NOTE — Progress Notes (Addendum)
Office Note 06/09/2018  CC: new pt/establish care + dizzy HPI:  Roger Carrillo is a 57 y.o. White male who is here to establish care, f/u recent ED visit for dizziness. Patient's most recent primary MD: none Old records in [facility] EMR were reviewed prior to or during today's visit.  ED visit ([facility]) 05/13/18-->records reviewed today. Presented for dizziness and vague L sided feeling, under lots of stress. CBC, CMET normal. EKG neg acute.  CXR normal.  Head CT neg except mild sinusitis.  MRI head neg except mild sinusitis. Pt treated with augmentin for sinusitis, meclizine for dizziness.  Was told to f/u with PCP and neurology.  Pt states he is unchanged. About 6 week hx of "dizziness": He stands up and feels lighthead : always worse in mornings.  Feels flushed and feels like he is going out.  Also commonly feels the swimmy headed sensation even while sitting.  Rare sensation of blurred vision "but nothing to mention".  No nausea.  Sensation lasts seconds to minutes at times, other times seems to be present "constantly" to a milder extent.  Constant ringing in both ears, R>L that started a few days after his dizziness started.  Denies hearing loss. No signif head trauma in the past.   No otc or rx meds started prior to sx's. No vertigo.  No syncope/LOC. Meclizine rx'd by ED did not help any.  No similar dizziness problems in his life before this. Significant life stress lately due to separation with his wife. No generalized or focal weakness, no CP, no SOB.  Some HA's in R parietal area and behind eyes.  Past Medical History:  Diagnosis Date  . CAP (community acquired pneumonia)   . Dizziness   . Metatarsal fracture    multiple, R foot    Past Surgical History:  Procedure Laterality Date  . APPENDECTOMY     early 63s  . CARDIAC CATHETERIZATION     [facility]--approx in his early 30s --normal (for persistent CP).  . COLONOSCOPY  2015-16   [facility]--normal screening.     Family History  Problem Relation Age of Onset  . Diabetes Mother   . Hypertension Mother   . Cancer Sister     Social History   Socioeconomic History  . Marital status: Legally Separated    Spouse name: Not on file  . Number of children: Not on file  . Years of education: Not on file  . Highest education level: Not on file  Occupational History  . Not on file  Social Needs  . Financial resource strain: Not on file  . Food insecurity:    Worry: Not on file    Inability: Not on file  . Transportation needs:    Medical: Not on file    Non-medical: Not on file  Tobacco Use  . Smoking status: Current Every Day Smoker    Packs/day: 0.80    Years: 38.00    Pack years: 30.40    Types: Cigarettes  . Smokeless tobacco: Never Used  Substance and Sexual Activity  . Alcohol use: Yes    Comment: occ  . Drug use: No  . Sexual activity: Not on file  Lifestyle  . Physical activity:    Days per week: Not on file    Minutes per session: Not on file  . Stress: Not on file  Relationships  . Social connections:    Talks on phone: Not on file    Gets together: Not on file  Attends religious service: Not on file    Active member of club or organization: Not on file    Attends meetings of clubs or organizations: Not on file    Relationship status: Not on file  . Intimate partner violence:    Fear of current or ex partner: Not on file    Emotionally abused: Not on file    Physically abused: Not on file    Forced sexual activity: Not on file  Other Topics Concern  . Not on file  Social History Narrative   Married, separated 2019.  No children.   Educ: comm college   Occup: driver, Systems analyst to job sites.   Alcohol: 4-5 beers per week.   Tobacco: 55 pack yr hx--current as of 05/2018.   Drugs: occ marijuana, otherwise none.    Outpatient Encounter Medications as of 06/09/2018  Medication Sig  . meclizine (ANTIVERT) 25 MG tablet Take 1 tablet (25 mg total) by mouth  3 (three) times daily as needed for dizziness.  Marland Kitchen acetaminophen (TYLENOL) 500 MG tablet Take 1,000 mg by mouth every 6 (six) hours as needed for moderate pain.  . Aspirin-Salicylamide-Caffeine (ARTHRITIS STRENGTH BC POWDER PO) Take 1 Package by mouth daily as needed.  . [DISCONTINUED] amoxicillin-clavulanate (AUGMENTIN) 875-125 MG tablet Take 1 tablet by mouth 2 (two) times daily. One po bid x 7 days   No facility-administered encounter medications on file as of 06/09/2018.     No Known Allergies  ROS:  Review of Systems  Constitutional: Negative for fatigue and fever.  HENT: Negative for congestion and sore throat.   Eyes: Negative for visual disturbance.  Respiratory: Negative for cough.   Cardiovascular: Negative for chest pain.  Gastrointestinal: Negative for abdominal pain and nausea.  Endocrine: Negative for polydipsia, polyphagia and polyuria.  Genitourinary: Negative for dysuria.  Musculoskeletal: Negative for back pain and joint swelling.  Skin: Negative for rash.  Neurological: Positive for dizziness and headaches (some R parietal and retro-orbital HAs). Negative for weakness.  Hematological: Negative for adenopathy.  Psychiatric/Behavioral: Negative for confusion and dysphoric mood. The patient is nervous/anxious (stress).      PE; Blood pressure 125/87, pulse 83, temperature 97.7 F (36.5 C), temperature source Oral, resp. rate 16, height 5' 10.5" (1.791 m), weight 207 lb (93.9 kg), SpO2 96 %. Body mass index is 29.28 kg/m. Orthostatics: Supine: 121/84, 69, Upright120/83, 76, Standing124/81, 81 Gen: Alert, well appearing.  Patient is oriented to person, place, time, and situation. AFFECT: pleasant, lucid thought and speech. ENT: Ears: EACs clear, normal epithelium.  TMs with good light reflex and landmarks bilaterally.  Eyes: no injection, icteris, swelling, or exudate.  EOMI, PERRLA. Nose: no drainage or turbinate edema/swelling.  No injection or focal lesion.  Mouth:  lips without lesion/swelling.  Oral mucosa pink and moist.  Dentition intact and without obvious caries or gingival swelling.  Oropharynx without erythema, exudate, or swelling.  Neck - No masses or thyromegaly or limitation in range of motion CV: RRR, no m/r/g.   LUNGS: CTA bilat, nonlabored resps, good aeration in all lung fields. ABD; soft, NT/ND EXT: no clubbing or cyanosis.  no edema.   Pertinent labs:   Lab Results  Component Value Date   WBC 9.2 05/13/2018   HGB 15.4 05/13/2018   HCT 45.9 05/13/2018   MCV 87.4 05/13/2018   PLT 273 05/13/2018   Lab Results  Component Value Date   CREATININE 1.00 05/13/2018   BUN 15 05/13/2018   NA 136 05/13/2018  K 3.9 05/13/2018   CL 104 05/13/2018   CO2 21 (L) 05/13/2018   Lab Results  Component Value Date   ALT 16 05/13/2018   AST 17 05/13/2018   ALKPHOS 54 05/13/2018   BILITOT 0.6 05/13/2018   Audiogram today: heard 20 db at 500, 1000, and 2000 Hz in each ear, but required 50 db in L ear and 70 db in R ear to hear the 4000 Hz tone.  ASSESSMENT AND PLAN:    New pt, no prior PCP records.  1) Episodic dizziness, constant tinnitus-->x 2 mo. Imaging, EKG, labs all unremarkable in ED 1 mo ago.  Orthostatics normal. 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 Question of meniere's dz: refer to ENT for further evaluation.  An After Visit Summary was printed and given to the patient.  Return in about 3 months (around 09/08/2018) for annual CPE (fasting).  Signed:  Santiago Bumpers, MD           06/09/2018

## 2018-07-03 ENCOUNTER — Encounter: Payer: Self-pay | Admitting: Family Medicine

## 2018-08-30 DIAGNOSIS — E7800 Pure hypercholesterolemia, unspecified: Secondary | ICD-10-CM

## 2018-08-30 HISTORY — DX: Pure hypercholesterolemia, unspecified: E78.00

## 2018-09-09 ENCOUNTER — Encounter: Payer: Self-pay | Admitting: Family Medicine

## 2018-09-09 ENCOUNTER — Ambulatory Visit (INDEPENDENT_AMBULATORY_CARE_PROVIDER_SITE_OTHER): Payer: Self-pay | Admitting: Family Medicine

## 2018-09-09 VITALS — BP 129/85 | HR 80 | Temp 98.1°F | Resp 16 | Ht 70.0 in | Wt 208.0 lb

## 2018-09-09 DIAGNOSIS — Z9189 Other specified personal risk factors, not elsewhere classified: Secondary | ICD-10-CM

## 2018-09-09 DIAGNOSIS — Z125 Encounter for screening for malignant neoplasm of prostate: Secondary | ICD-10-CM

## 2018-09-09 DIAGNOSIS — Z114 Encounter for screening for human immunodeficiency virus [HIV]: Secondary | ICD-10-CM

## 2018-09-09 DIAGNOSIS — Z1159 Encounter for screening for other viral diseases: Secondary | ICD-10-CM

## 2018-09-09 DIAGNOSIS — Z0000 Encounter for general adult medical examination without abnormal findings: Secondary | ICD-10-CM

## 2018-09-09 LAB — COMPREHENSIVE METABOLIC PANEL
ALT: 11 U/L (ref 0–53)
AST: 14 U/L (ref 0–37)
Albumin: 4.8 g/dL (ref 3.5–5.2)
Alkaline Phosphatase: 57 U/L (ref 39–117)
BUN: 12 mg/dL (ref 6–23)
CO2: 28 meq/L (ref 19–32)
Calcium: 10.1 mg/dL (ref 8.4–10.5)
Chloride: 104 mEq/L (ref 96–112)
Creatinine, Ser: 1.02 mg/dL (ref 0.40–1.50)
GFR: 79.55 mL/min (ref >60.00)
Glucose, Bld: 94 mg/dL (ref 70–99)
Potassium: 4.6 mEq/L (ref 3.5–5.1)
SODIUM: 139 meq/L (ref 135–145)
Total Bilirubin: 0.4 mg/dL (ref 0.2–1.2)
Total Protein: 7.8 g/dL (ref 6.0–8.3)

## 2018-09-09 LAB — CBC WITH DIFFERENTIAL/PLATELET
BASOS ABS: 0.1 10*3/uL (ref 0.0–0.1)
BASOS PCT: 0.9 % (ref 0.0–3.0)
EOS ABS: 0.2 10*3/uL (ref 0.0–0.7)
Eosinophils Relative: 2.7 % (ref 0.0–5.0)
HCT: 49.7 % (ref 39.0–52.0)
Hemoglobin: 16.6 g/dL (ref 13.0–17.0)
Lymphocytes Relative: 39.6 % (ref 12.0–46.0)
Lymphs Abs: 2.7 10*3/uL (ref 0.7–4.0)
MCHC: 33.4 g/dL (ref 30.0–36.0)
MCV: 90.4 fl (ref 78.0–100.0)
MONO ABS: 0.6 10*3/uL (ref 0.1–1.0)
MONOS PCT: 8.1 % (ref 3.0–12.0)
Neutro Abs: 3.4 10*3/uL (ref 1.4–7.7)
Neutrophils Relative %: 48.7 % (ref 43.0–77.0)
Platelets: 346 10*3/uL (ref 150.0–400.0)
RBC: 5.5 Mil/uL (ref 4.22–5.81)
RDW: 14.9 % (ref 11.5–15.5)
WBC: 6.9 10*3/uL (ref 4.0–10.5)

## 2018-09-09 LAB — LIPID PANEL
CHOLESTEROL: 204 mg/dL — AB (ref 0–200)
HDL: 45.9 mg/dL (ref >39.00)
LDL Cholesterol: 123 mg/dL — ABNORMAL HIGH (ref 0–99)
NonHDL: 158.17
Total CHOL/HDL Ratio: 4
Triglycerides: 175 mg/dL — ABNORMAL HIGH (ref 0.0–149.0)
VLDL: 35 mg/dL (ref 0.0–40.0)

## 2018-09-09 LAB — PSA: PSA: 1.65 ng/mL (ref 0.10–4.00)

## 2018-09-09 LAB — TSH: TSH: 1.52 u[IU]/mL (ref 0.35–4.50)

## 2018-09-09 NOTE — Patient Instructions (Signed)

## 2018-09-09 NOTE — Progress Notes (Signed)
OFFICE VISIT  09/09/2018   CC:  Chief Complaint  Patient presents with  . Annual Exam   HPI:    Patient is a 57 y.o. Caucasian male who presents for annual health maintenance exam.  Exercise: only on the job.  No formal exercise. Diet: not working on anything in particular. Tob: trying to cut back , currently 1 pack per day. Alc: minimal alcohol Dental and eye: not getting any exams.'  No acute complaints.  Past Medical History:  Diagnosis Date  . CAP (community acquired pneumonia)   . Dizziness    06/2018 ENT eval by Dr. Danelle Berry, possible positional vertigo, no further w/u recommended since pt was improving.  . Metatarsal fracture    multiple, R foot    Past Surgical History:  Procedure Laterality Date  . APPENDECTOMY     early 71s  . CARDIAC CATHETERIZATION     [facility]--approx in his early 30s --normal (for persistent CP).  . COLONOSCOPY  2015-16   [facility]--normal screening.    Outpatient Medications Prior to Visit  Medication Sig Dispense Refill  . acetaminophen (TYLENOL) 500 MG tablet Take 1,000 mg by mouth every 6 (six) hours as needed for moderate pain.    . Aspirin-Salicylamide-Caffeine (ARTHRITIS STRENGTH BC POWDER PO) Take 1 Package by mouth daily as needed.    . meclizine (ANTIVERT) 25 MG tablet Take 1 tablet (25 mg total) by mouth 3 (three) times daily as needed for dizziness. (Patient not taking: Reported on 09/09/2018) 30 tablet 0   No facility-administered medications prior to visit.     No Known Allergies  ROS Review of Systems  Constitutional: Negative for appetite change, chills, fatigue and fever.  HENT: Negative for congestion, dental problem, ear pain and sore throat.   Eyes: Negative for discharge, redness and visual disturbance.  Respiratory: Negative for cough, chest tightness, shortness of breath and wheezing.   Cardiovascular: Negative for chest pain, palpitations and leg swelling.  Gastrointestinal: Negative  for abdominal pain, blood in stool, diarrhea, nausea and vomiting.  Genitourinary: Negative for difficulty urinating, dysuria, flank pain, frequency, hematuria and urgency.  Musculoskeletal: Negative for arthralgias, back pain, joint swelling, myalgias and neck stiffness.  Skin: Negative for pallor and rash.  Neurological: Negative for dizziness, speech difficulty, weakness and headaches.       Occ mild lightheadedness and ringing in ears-->"its nothing new, just what I have all the time"  Hematological: Negative for adenopathy. Does not bruise/bleed easily.  Psychiatric/Behavioral: Negative for confusion and sleep disturbance. The patient is not nervous/anxious.      PE: Blood pressure 129/85, pulse 80, temperature 98.1 F (36.7 C), temperature source Oral, resp. rate 16, height 5\' 10"  (1.778 m), weight 208 lb (94.3 kg), SpO2 97 %. Body mass index is 29.84 kg/m. Gen: Alert, well appearing.  Patient is oriented to person, place, time, and situation. AFFECT: pleasant, lucid thought and speech. ENT: Ears: EACs clear, normal epithelium.  TMs with good light reflex and landmarks bilaterally.  Eyes: no injection, icteris, swelling, or exudate.  EOMI, PERRLA. Nose: no drainage or turbinate edema/swelling.  No injection or focal lesion.  Mouth: lips without lesion/swelling.  Oral mucosa pink and moist.  Dentition intact and without obvious caries or gingival swelling.  Oropharynx without erythema, exudate, or swelling.  Neck: supple/nontender.  No LAD, mass, or TM.  Carotid pulses 2+ bilaterally, without bruits. CV: RRR, no m/r/g.   LUNGS: CTA bilat, nonlabored resps, good aeration in all lung fields. ABD: soft, NT, ND,  BS normal.  No hepatospenomegaly or mass.  No bruits. EXT: no clubbing, cyanosis, or edema.  Musculoskeletal: no joint swelling, erythema, warmth, or tenderness.  ROM of all joints intact. Skin - no sores or suspicious lesions or rashes or color changes Rectal exam: negative  without mass, lesions or tenderness, PROSTATE EXAM: smooth and symmetric without nodules or tenderness.      LABS:  No results found for: TSH Lab Results  Component Value Date   WBC 9.2 05/13/2018   HGB 15.4 05/13/2018   HCT 45.9 05/13/2018   MCV 87.4 05/13/2018   PLT 273 05/13/2018   Lab Results  Component Value Date   CREATININE 1.00 05/13/2018   BUN 15 05/13/2018   NA 136 05/13/2018   K 3.9 05/13/2018   CL 104 05/13/2018   CO2 21 (L) 05/13/2018   Lab Results  Component Value Date   ALT 16 05/13/2018   AST 17 05/13/2018   ALKPHOS 54 05/13/2018   BILITOT 0.6 05/13/2018   No results found for: CHOL No results found for: HDL No results found for: LDLCALC No results found for: TRIG No results found for: CHOLHDL No results found for: PSA  No results found for: HGBA1C  IMPRESSION AND PLAN:  Health maintenance exam: Reviewed age and gender appropriate health maintenance issues (prudent diet, regular exercise, health risks of tobacco and excessive alcohol, use of seatbelts, fire alarms in home, use of sunscreen).  Also reviewed age and gender appropriate health screening as well as vaccine recommendations. Vaccines: flu vaccine--> pt declined.   Shingrix vaccine-->pt declined. Labs: fasting HP + PSA. Prostate ca screening: DRE normal,PSA. Colon ca screening: next colonoscopy due 2026.   An After Visit Summary was printed and given to the patient.  FOLLOW UP: Return in about 1 year (around 09/10/2019) for annual CPE (fasting).  Signed:  Santiago BumpersPhil Othello Sgroi, MD           09/09/2018

## 2018-09-10 ENCOUNTER — Encounter: Payer: Self-pay | Admitting: Family Medicine

## 2018-09-10 LAB — HIV ANTIBODY (ROUTINE TESTING W REFLEX): HIV 1&2 Ab, 4th Generation: NONREACTIVE

## 2018-09-10 LAB — HEPATITIS C ANTIBODY
HEP C AB: NONREACTIVE
SIGNAL TO CUT-OFF: 0.03 (ref <1.00)

## 2018-09-15 MED ORDER — ATORVASTATIN CALCIUM 40 MG PO TABS: 40.0000 mg | ORAL_TABLET | Freq: Every day | ORAL | 2 refills | Status: DC

## 2018-09-15 NOTE — Addendum Note (Signed)
Addended by: Regan RakersAY, Keinan Brouillet K on: 09/15/2018 08:50 AM   Modules accepted: Orders

## 2018-11-20 ENCOUNTER — Encounter: Payer: Self-pay | Admitting: Family Medicine

## 2018-11-20 ENCOUNTER — Ambulatory Visit (INDEPENDENT_AMBULATORY_CARE_PROVIDER_SITE_OTHER): Payer: Self-pay | Admitting: Family Medicine

## 2018-11-20 VITALS — BP 115/75 | HR 74 | Temp 98.0°F | Resp 16 | Ht 70.0 in | Wt 206.4 lb

## 2018-11-20 DIAGNOSIS — J069 Acute upper respiratory infection, unspecified: Secondary | ICD-10-CM

## 2018-11-20 DIAGNOSIS — J209 Acute bronchitis, unspecified: Secondary | ICD-10-CM

## 2018-11-20 MED ORDER — PREDNISONE 20 MG PO TABS
ORAL_TABLET | ORAL | 0 refills | Status: DC
Start: 2018-11-20 — End: 2021-10-19

## 2018-11-20 NOTE — Progress Notes (Signed)
OFFICE VISIT  11/20/2018   CC:  Chief Complaint  Patient presents with  . URI    head and chest congestion, productive cough w/ little phlegm, pt is not fasting   HPI:    Patient is a 58 y.o. Caucasian male who presents for respiratory complaints. Onset 11 d/a, some chills, malaise/aches, fatigue, HA.  Then, fever broke and he started getting  head/nasal congestion, lots of coughing/rattling in chest.  No fevers anymore. PO intake now is fine.  Has had some soft stools.  No n/v. Some abd cramping for a few days but none lately. OTC meds no help. He is a current smoker.  Past Medical History:  Diagnosis Date  . CAP (community acquired pneumonia)   . Dizziness    06/2018 ENT eval by Dr. Danelle Berry, possible positional vertigo, no further w/u recommended since pt was improving.  Marland Kitchen Hypercholesterolemia 08/2018   recommended statin 08/2018  . Metatarsal fracture    multiple, R foot  . Tobacco dependence    current as of 08/2018    Past Surgical History:  Procedure Laterality Date  . APPENDECTOMY     early 57s  . CARDIAC CATHETERIZATION     [facility]--approx in his early 30s --normal (for persistent CP).  . COLONOSCOPY  2015-16   [facility]--normal screening.    Outpatient Medications Prior to Visit  Medication Sig Dispense Refill  . acetaminophen (TYLENOL) 500 MG tablet Take 1,000 mg by mouth every 6 (six) hours as needed for moderate pain.    Marland Kitchen atorvastatin (LIPITOR) 40 MG tablet Take 1 tablet (40 mg total) by mouth daily. 30 tablet 2  . Aspirin-Salicylamide-Caffeine (ARTHRITIS STRENGTH BC POWDER PO) Take 1 Package by mouth daily as needed.    . meclizine (ANTIVERT) 25 MG tablet Take 1 tablet (25 mg total) by mouth 3 (three) times daily as needed for dizziness. (Patient not taking: Reported on 09/09/2018) 30 tablet 0   No facility-administered medications prior to visit.     No Known Allergies  ROS As per HPI  PE: Blood pressure 115/75, pulse 74,  temperature 98 F (36.7 C), temperature source Oral, resp. rate 16, height 5\' 10"  (1.778 m), weight 206 lb 6.4 oz (93.6 kg), SpO2 98 %. VS: noted--normal. Gen: alert, NAD, NONTOXIC APPEARING. HEENT: eyes without injection, drainage, or swelling.  Ears: EACs clear, TMs with normal light reflex and landmarks.  Nose: Clear rhinorrhea, with some dried, crusty exudate adherent to mildly injected mucosa.  No purulent d/c.  No paranasal sinus TTP.  No facial swelling.  Throat and mouth without focal lesion.  No pharyngial swelling, erythema, or exudate.   Neck: supple, no LAD.   LUNGS: CTA bilat, nonlabored resps.   CV: RRR, no m/r/g. EXT: no c/c/e SKIN: no rash  LABS:    Chemistry      Component Value Date/Time   NA 139 09/09/2018 0851   K 4.6 09/09/2018 0851   CL 104 09/09/2018 0851   CO2 28 09/09/2018 0851   BUN 12 09/09/2018 0851   CREATININE 1.02 09/09/2018 0851      Component Value Date/Time   CALCIUM 10.1 09/09/2018 0851   ALKPHOS 57 09/09/2018 0851   AST 14 09/09/2018 0851   ALT 11 09/09/2018 0851   BILITOT 0.4 09/09/2018 0851      IMPRESSION AND PLAN:  1) URI with cough, suspect he has a significant component of bronchitis. No sign of bacterial infection.  +Ongoing tobacco abuse. Encouraged smoking cessation. Prednisone 40mg  qd  x 5d, then 20mg  qd x 5d. Get otc generic robitussin DM OR Mucinex DM and use as directed on the packaging for cough and congestion. Use otc generic saline nasal spray 2-3 times per day to irrigate/moisturize your nasal passages.  An After Visit Summary was printed and given to the patient.  FOLLOW UP: Return if symptoms worsen or fail to improve.  Signed:  Santiago Bumpers, MD           11/20/2018

## 2018-11-20 NOTE — Patient Instructions (Addendum)
Get otc generic robitussin DM OR Mucinex DM and use as directed on the packaging for cough and congestion. Use otc generic saline nasal spray 2-3 times per day to irrigate/moisturize your nasal passages.  You may have a prolonged cough with this kind of illness-->up to 4-6 weeks.

## 2019-03-02 ENCOUNTER — Ambulatory Visit: Payer: Self-pay | Admitting: *Deleted

## 2019-03-02 NOTE — Telephone Encounter (Signed)
Pt called c/o having some moments of dizziness that he stated has been going on for a couple of months. He also states his left eye is blurry. He denies shortness of breath or chest pain. But states under his left arm if feels "funky".. There is no pain or numbness there. 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 He is not having dizziness right now. He was diagnosed with vertigo and put on meclizine. He reported that it did not help him. His b/p is 140/91 right now.  Advised him that if his symptoms increase he would need to get to the hospital. Also advised him to hydrated with water and cut back on his smoking. He voiced understanding. He would like an appointment tomorrow. Will route to [facility] at [facility] for review and recommendation for an appointment.   Reason for Disposition . [1] MODERATE dizziness (e.g., vertigo; feels very unsteady, interferes with normal activities) AND [2] has been evaluated by physician for this  Answer Assessment - Initial Assessment Questions 1. DESCRIPTION: "Describe your dizziness."     woozy 2. VERTIGO: "Do you feel like either you or the room is spinning or tilting?"      no 3. LIGHTHEADED: "Do you feel lightheaded?" (e.g., somewhat faint, woozy, weak upon standing)     woozy 4. SEVERITY: "How bad is it?"  "Can you walk?"   - MILD - Feels unsteady but walking normally.   - MODERATE - Feels very unsteady when walking, but not falling; interferes with normal activities (e.g., school, work) .   - SEVERE - Unable to walk without falling (requires assistance).     First thing in the morning is really bad 5. ONSET:  "When did the dizziness begin?"     For a couple 6. AGGRAVATING FACTORS: "Does anything make it worse?" (e.g., standing, change in head position)     Standing up first thing in the morning 7. CAUSE: "What do you think is causing the dizziness?"     Not sure 8. RECURRENT SYMPTOM: "Have you had dizziness  before?" If so, ask: "When was the last time?" "What happened that time?"     Has had before and was put on medication that did not help 9. OTHER SYMPTOMS: "Do you have any other symptoms?" (e.g., headache, weakness, numbness, vomiting, earache)     Headache, left eye is blurry 10. PREGNANCY: "Is there any chance you are pregnant?" "When was your last menstrual period?"       n/a  Protocols used: DIZZINESS - VERTIGO-A-AH

## 2019-03-03 ENCOUNTER — Encounter: Payer: Self-pay | Admitting: Family Medicine

## 2019-03-03 NOTE — Telephone Encounter (Signed)
Pt was offered in office appt for tomorrow, declined stating he took off today hoping to be seen. If he feels bad, he will go to ED or urgent care.

## 2019-03-03 NOTE — Telephone Encounter (Signed)
No opening today. Looks like I have openings tomorrow though.  In office visit is fine.

## 2019-03-03 NOTE — Telephone Encounter (Signed)
Pt would prefer in office over virtual visit. Schedule is practically filled for today.  Please advise, thanks.

## 2019-06-23 ENCOUNTER — Telehealth: Payer: Self-pay | Admitting: Family Medicine

## 2019-06-23 MED ORDER — AMOXICILLIN-POT CLAVULANATE 875-125 MG PO TABS: 1.0000 | ORAL_TABLET | Freq: Two times a day (BID) | ORAL | 0 refills | Status: DC

## 2019-06-23 NOTE — Telephone Encounter (Signed)
Patient requesting an antibiotic for tooth infection. Cant get into a dentist soon enough.  Roger Carrillo  Patient can be reached at [phone]

## 2019-06-23 NOTE — Telephone Encounter (Signed)
Please advise if something can be sent or appt needed.

## 2019-06-23 NOTE — Telephone Encounter (Signed)
OK, augmentin eRx'd

## 2019-09-10 ENCOUNTER — Encounter: Payer: Self-pay | Admitting: Family Medicine

## 2019-10-14 ENCOUNTER — Encounter: Payer: Self-pay | Admitting: Family Medicine

## 2019-11-24 ENCOUNTER — Encounter: Payer: Self-pay | Admitting: Family Medicine

## 2020-03-02 IMAGING — MR MR HEAD W/O CM
8 of 10 series · 42 of 48 positions shown · non-contrast
Comparison: Head CT earlier today

CLINICAL DATA: Focal neuro deficit.  Sudden onset of dizziness

EXAM:
MRI HEAD WITHOUT CONTRAST
TECHNIQUE: Multiplanar, multiecho pulse sequences of the brain and surrounding
structures were obtained without intravenous contrast.

[Series 3: DWI · axial · 3.0mm · 0.75mm/px · z∈[-6,+147]mm · 7 of 55 slices shown (1 of 4)]
[im 1/55]
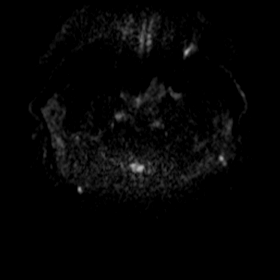
[im 10/55]
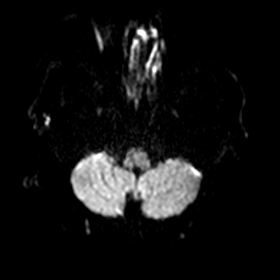
[im 19/55]
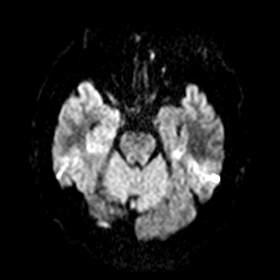
[im 28/55]
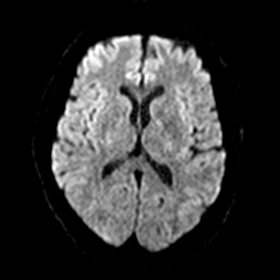
[im 37/55]
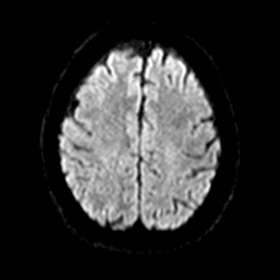
[im 46/55]
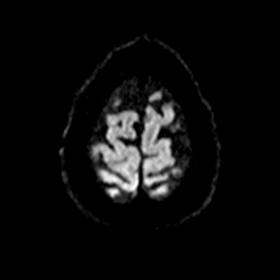
[im 55/55]
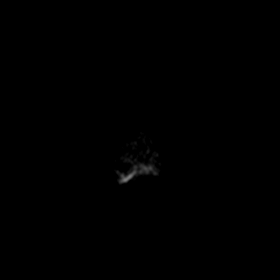

[Series 4: DWI · axial · 3.0mm · 0.78mm/px · z∈[-3,+150]mm · 7 of 55 slices shown (2 of 4)]
[im 1/55]
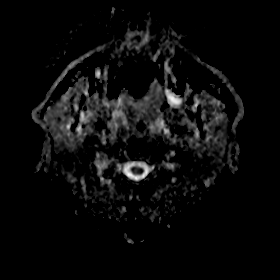
[im 10/55]
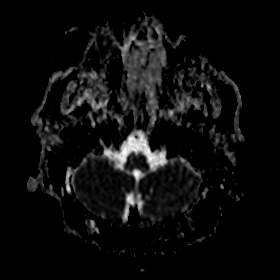
[im 19/55]
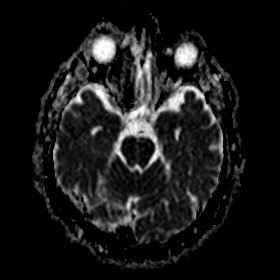
[im 28/55]
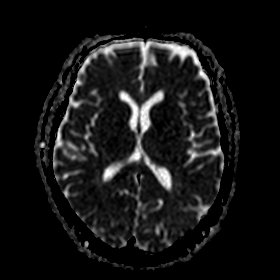
[im 37/55]
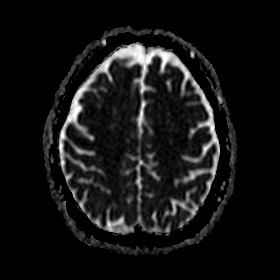
[im 46/55]
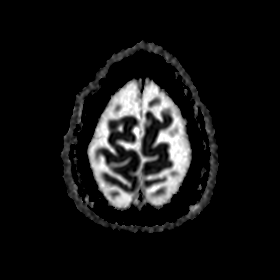
[im 55/55]
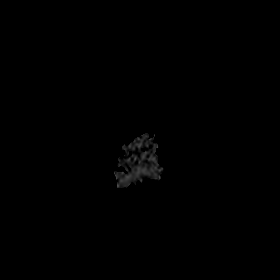

[Series 5: DWI · coronal · 5.0mm · 0.44mm/px · 4 of 34 slices shown (3 of 4)]
[im 1/34]
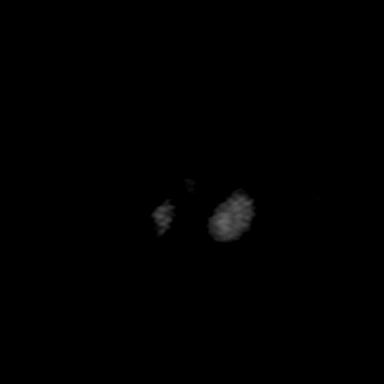
[im 12/34]
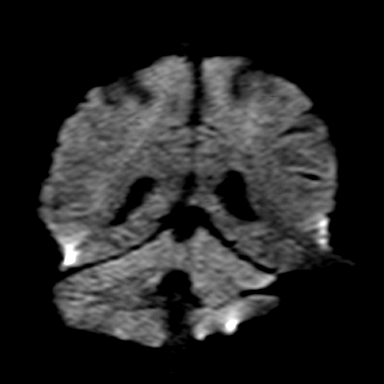
[im 23/34]
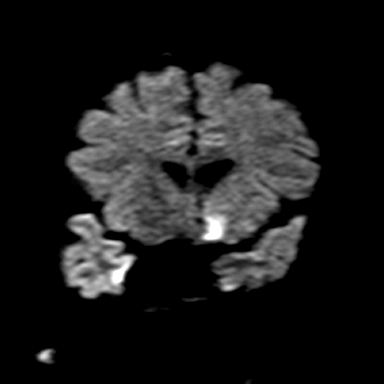
[im 34/34]
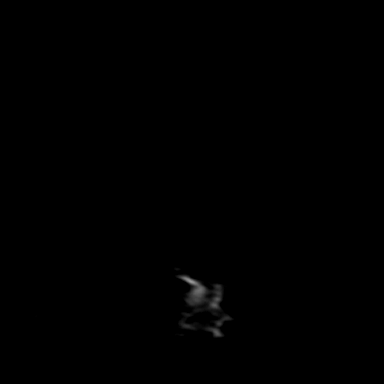

[Series 6: DWI · coronal · 5.0mm · 0.50mm/px · 4 of 34 slices shown (4 of 4)]
[im 1/34]
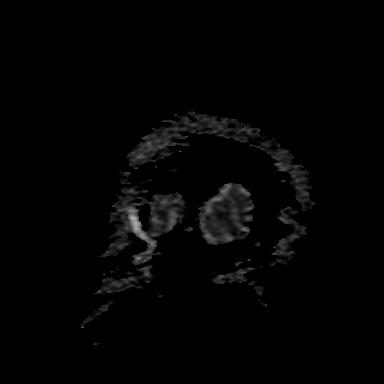
[im 12/34]
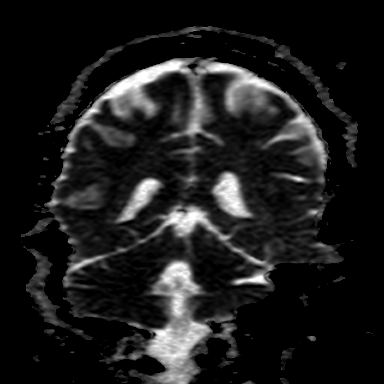
[im 23/34]
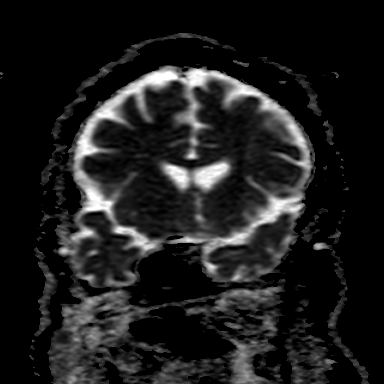
[im 34/34]
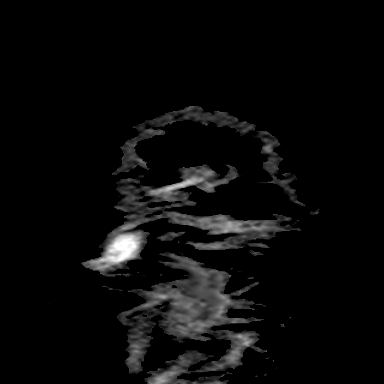

[Series 7: T2 · axial · 5.0mm · 0.67mm/px · z∈[+7,+142]mm · 3 of 23 slices shown (1 of 2)]
[im 1/23]
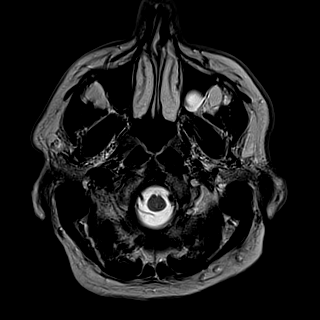
[im 12/23]
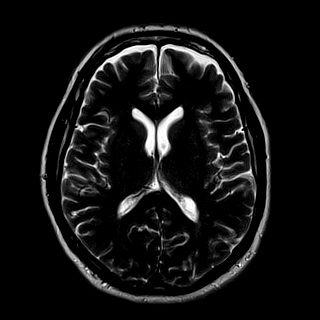
[im 23/23]
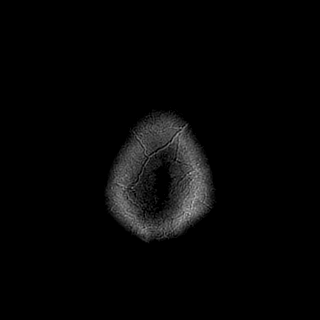

[Series 8: FLAIR · axial · 3.0mm · 0.84mm/px · z∈[+9,+139]mm · 6 of 47 slices shown]
[im 1/47]
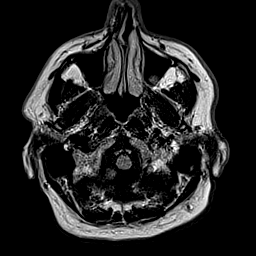
[im 10/47]
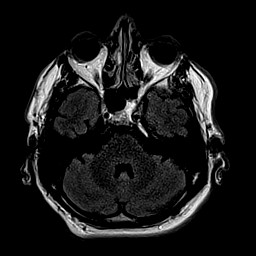
[im 19/47]
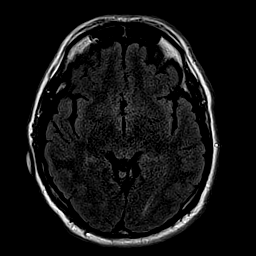
[im 28/47]
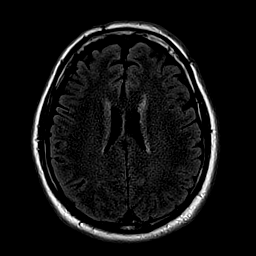
[im 37/47]
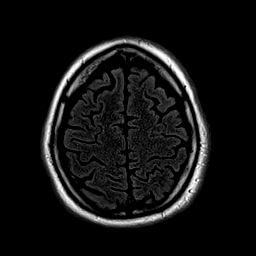
[im 47/47]
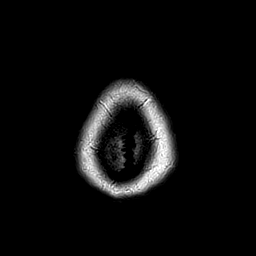

[Series 9: T1 · axial · 2.0mm · 0.42mm/px · z∈[-2,+134]mm · 8 of 73 slices shown]
[im 1/73]
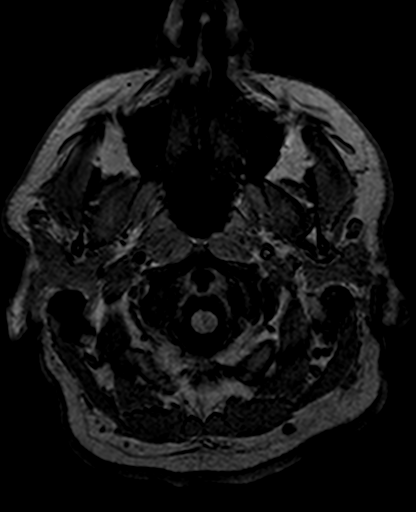
[im 10/73]
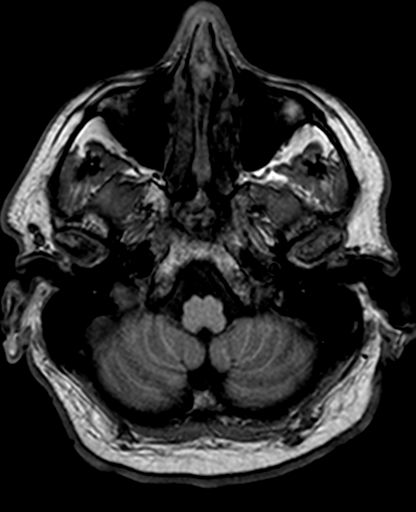
[im 19/73]
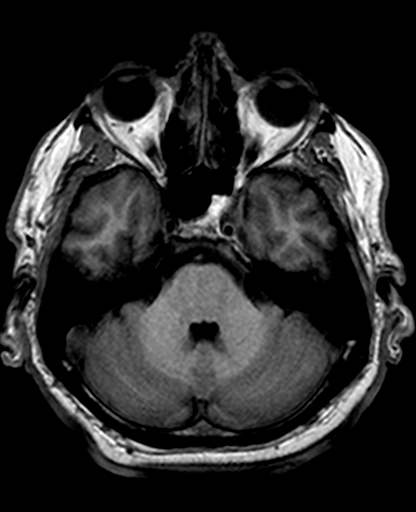
[im 28/73]
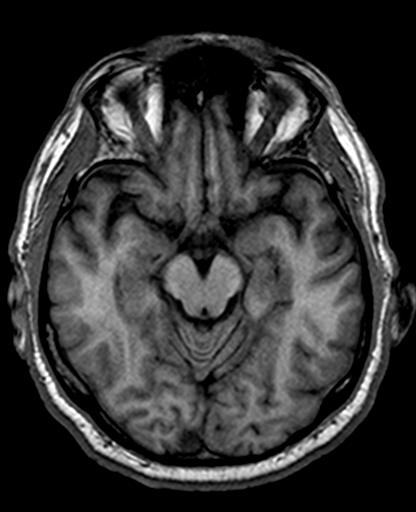
[im 46/73]
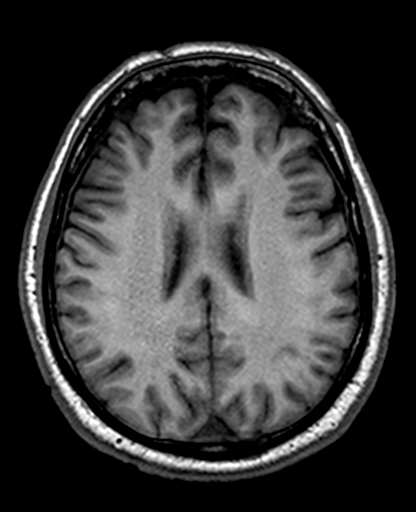
[im 55/73]
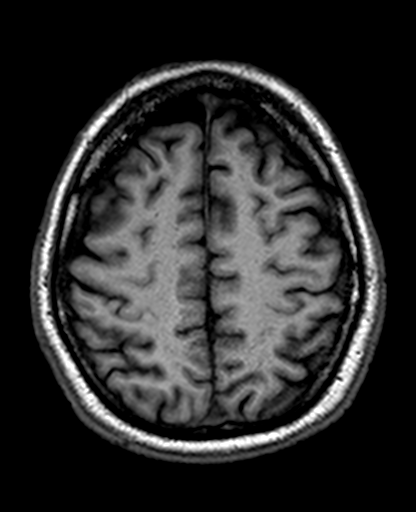
[im 64/73]
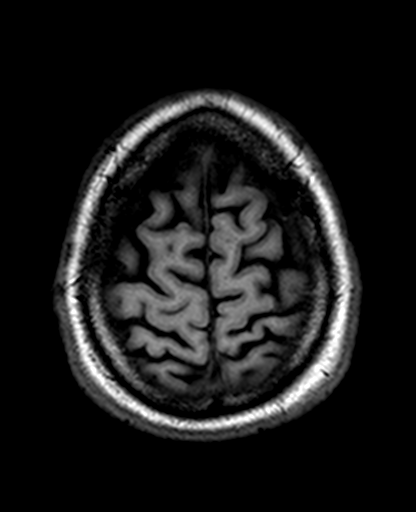
[im 73/73]
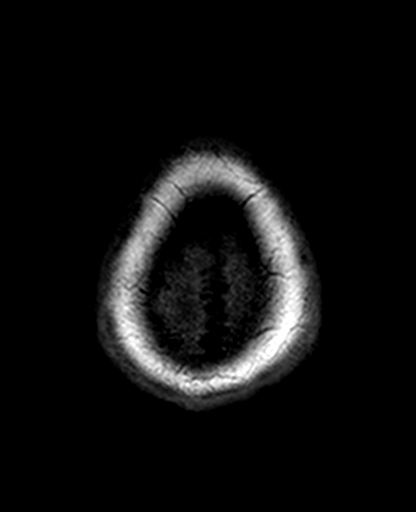

[Series 11: T2 · coronal · 5.0mm · 0.60mm/px · 3 of 28 slices shown (2 of 2)]
[im 1/28]
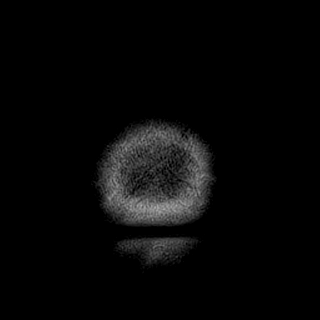
[im 14/28]
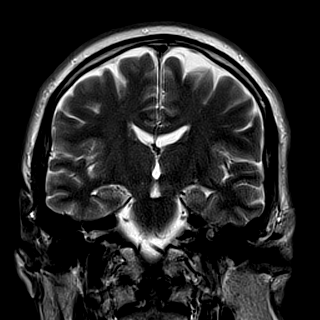
[im 28/28]
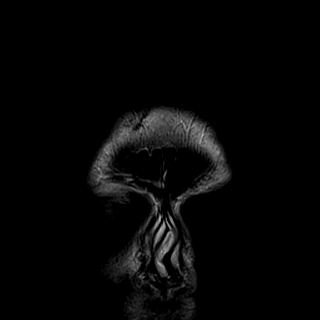

[42 of 48 positions shown; findings below may reference images not displayed]

FINDINGS: Brain: No acute infarction, hemorrhage, hydrocephalus, extra-axial
collection or mass lesion. Small FLAIR hyperintensity and left
periatrial white matter from nonspecific remote insult, commonly
seen by the patient's age.

Vascular: Major flow voids and vascular enhancements are preserved.

Skull and upper cervical spine: No evidence of marrow lesion

Sinuses/Orbits: Clear mastoid and middle ear spaces. Mild mucosal
thickening in the right inferior frontal and bilateral ethmoid
sinuses. Presumed retention cyst in the inferior left maxillary
sinus.
IMPRESSION: 1. No acute finding.  Negative for posterior circulation infarct.
2. Mild sinusitis.

## 2021-10-17 ENCOUNTER — Telehealth: Payer: Self-pay

## 2021-10-17 NOTE — Telephone Encounter (Signed)
User error

## 2021-10-18 DIAGNOSIS — R42 Dizziness and giddiness: Secondary | ICD-10-CM | POA: Insufficient documentation

## 2021-10-19 ENCOUNTER — Other Ambulatory Visit: Payer: Self-pay

## 2021-10-19 ENCOUNTER — Encounter: Payer: Self-pay | Admitting: Family Medicine

## 2021-10-19 ENCOUNTER — Ambulatory Visit (INDEPENDENT_AMBULATORY_CARE_PROVIDER_SITE_OTHER): Payer: 59 | Admitting: Family Medicine

## 2021-10-19 VITALS — BP 146/88 | HR 73 | Temp 97.6°F | Ht 70.0 in | Wt 206.8 lb

## 2021-10-19 DIAGNOSIS — J019 Acute sinusitis, unspecified: Secondary | ICD-10-CM | POA: Diagnosis not present

## 2021-10-19 DIAGNOSIS — R21 Rash and other nonspecific skin eruption: Secondary | ICD-10-CM

## 2021-10-19 MED ORDER — AMOXICILLIN 875 MG PO TABS: 875.0000 mg | ORAL_TABLET | Freq: Two times a day (BID) | ORAL | 0 refills | Status: AC

## 2021-10-19 NOTE — Progress Notes (Signed)
OFFICE VISIT  10/19/2021  CC:  Chief Complaint  Patient presents with   Sinus congestion    Pt c/o sinus pressure, coughing up white phlegm. Has used Nightquil and Alka-Seltzer cold plus Presented with rash 1-2 months ago on back, shoulders, inner thighs. Appears to be hives. Changed detergent, soap, deodorant after rash developed. Has used benadryl cream but not helping any.     HPI:    Patient is a 61 y.o. male who presents for "sinus congestion". I last saw him almost 3 years ago--acute bronchitis at that time.  HPI: He reports having flu about a month ago.  Had 3 days of significant typical flu symptoms at that time.  Says everything cleared up for the most part but about 3 days after that he got return of significant nasal congestion, sinus pressure, and some postnasal drip coughing and the symptoms have persisted.  The postnasal drip cough is minimal, mainly has a cough in the mornings productive of some thin white sputum.  Still smoking cigs.  No fevers, no wheezing, no shortness of breath.  Also notes a light pinkish rash came on his back and shoulders about 6 weeks ago.  Around the same time he noted a rash in the groin area that has been itchy some.  The rash on the back has had minimal itching.  ROS as above, plus--> no CP, no dizziness, some peri-orbital HAs, no melena/hematochezia.  No polyuria or polydipsia.  No myalgias or arthralgias.  No focal weakness, paresthesias, or tremors.  No acute vision or hearing abnormalities.  No dysuria or unusual/new urinary urgency or frequency.  No recent changes in lower legs. No n/v/d or abd pain.  No palpitations.    Past Medical History:  Diagnosis Date   CAP (community acquired pneumonia)    Dizziness    MRI brain normal 04/2018.  06/2018 ENT eval by Dr. Danelle Berry, possible positional vertigo, no further w/u recommended since pt was improving.   Hypercholesterolemia 08/2018   recommended statin 08/2018   Metatarsal  fracture    multiple, R foot   Tobacco dependence    current as of 08/2018    Past Surgical History:  Procedure Laterality Date   APPENDECTOMY     early 37s   [place]ARDI[place]     [place]approx in his early 30s --normal (for persistent CP).   COLONOSCOPY  2015-16   [facility]--normal screening.    Outpatient Medications Prior to Visit  Medication Sig Dispense Refill   acetaminophen (TYLENOL) 500 MG tablet Take 1,000 mg by mouth every 6 (six) hours as needed for moderate pain.     Aspirin-Salicylamide-Caffeine (ARTHRITIS STRENGTH BC POWDER PO) Take 1 Package by mouth daily as needed.     atorvastatin (LIPITOR) 40 MG tablet Take 1 tablet (40 mg total) by mouth daily. (Patient not taking: Reported on 10/19/2021) 30 tablet 2   meclizine (ANTIVERT) 25 MG tablet Take 1 tablet (25 mg total) by mouth 3 (three) times daily as needed for dizziness. (Patient not taking: Reported on 09/09/2018) 30 tablet 0   amoxicillin-clavulanate (AUGMENTIN) 875-125 MG tablet Take 1 tablet by mouth 2 (two) times daily. (Patient not taking: Reported on 10/19/2021) 20 tablet 0   predniSONE (DELTASONE) 20 MG tablet 2 tabs po qd x 5d, then 1 tab po qd x 5d (Patient not taking: Reported on 10/19/2021) 15 tablet 0   No facility-administered medications prior to visit.    No Known Allergies  ROS As per HPI  PE:  Vitals with BMI 10/19/2021 11/20/2018 09/09/2018  Height 5\' 10"  5\' 10"  5\' 10"   Weight 206 lbs 13 oz 206 lbs 6 oz 208 lbs  BMI 29.67 29.62 29.84  Systolic 146 115  Diastolic 88 75 85  Pulse 73 74 80     Physical Exam  VS: noted--normal. Gen: alert, NAD, NONTOXIC APPEARING. HEENT: eyes without injection, drainage, or swelling.  Ears: EACs clear, TMs with normal light reflex and landmarks.  Nose: Clear rhinorrhea, with some dried, crusty exudate adherent to mildly injected mucosa.  No purulent d/c.  No paranasal sinus TTP.  No facial swelling.  Throat and mouth without focal lesion.   No pharyngial swelling, erythema, or exudate.   Neck: supple, no LAD.   LUNGS: CTA bilat, nonlabored resps.   CV: RRR, no m/r/g. EXT: no c/c/e SKIN: Upper inner thighs and groin creases with hyperpigmented macular rash with well-demarcated borders.  No pustules, no hives, no erythema. Starting it lower thoracic area on his back there is a very light pink, confluent, nonraised rash with very fine superficial peeling.  A couple of more distinct oval macular lesions can be seen at the right inferior border of this --- suggestive of herald lesions.  No hives, no pustules or papules.  LABS:  Last CBC Lab Results  Component Value Date   WBC 6.9 09/09/2018   HGB 16.6 09/09/2018   HCT 49.7 09/09/2018   MCV 90.4 09/09/2018   MCH 29.3 05/13/2018   RDW 14.9 09/09/2018   PLT 346.0 09/09/2018   Last metabolic panel Lab Results  Component Value Date   GLUCOSE 94 09/09/2018   NA 139 09/09/2018   K 4.6 09/09/2018   CL 104 09/09/2018   CO2 28 09/09/2018   BUN 12 09/09/2018   CREATININE 1.02 09/09/2018   GFRNONAA >60 05/13/2018   CALCIUM 10.1 09/09/2018   PROT 7.8 09/09/2018   ALBUMIN 4.8 09/09/2018   BILITOT 0.4 09/09/2018   ALKPHOS 57 09/09/2018   AST 14 09/09/2018   ALT 11 09/09/2018   ANIONGAP 11 05/13/2018    IMPRESSION AND PLAN:  1) acute sinusitis. Amoxicillin 875 twice daily x10 days. Continue saline nasal spray.  Encouraged smoking cessation.  2.  Rash on back--very subtle, possibly atypical pityriasis rosea that is clearing up. Pityriasis versicolor less likely. Continue observation at this time.  3.  Groin rash.  I think this is a separate issue from his back rash. Appears more like tinea cruris.  Recommend Lamisil cream twice daily.  Encouraged him to try to keep the area cool and dry.  An After Visit Summary was printed and given to the patient.  FOLLOW UP: Return if symptoms worsen or fail to improve.  Signed:  14/07/2018, MD           10/19/2021

## 2021-10-19 NOTE — Patient Instructions (Signed)
Buy over the counter generic lamisil cream and apply to groin rash twice a day---this may take 4-6 wks to completely clear up.

## 2021-11-08 ENCOUNTER — Telehealth: Payer: Self-pay

## 2021-11-08 NOTE — Telephone Encounter (Signed)
Pt advised f/u recommended prior to new medications being prescribed. Offered appt, pt agreed. Pt scheduled.

## 2021-11-08 NOTE — Telephone Encounter (Signed)
Patient was seen a couple of weeks ago by Dr. Milinda Cave.  He was prescribed antibiotic.  Patient is still having symptoms and would like to request another round of meds or to change to something else.    Please call [phone]

## 2021-11-09 ENCOUNTER — Encounter: Payer: Self-pay | Admitting: Family Medicine

## 2021-11-09 ENCOUNTER — Ambulatory Visit (INDEPENDENT_AMBULATORY_CARE_PROVIDER_SITE_OTHER): Payer: 59 | Admitting: Family Medicine

## 2021-11-09 ENCOUNTER — Other Ambulatory Visit: Payer: Self-pay

## 2021-11-09 VITALS — BP 122/77 | HR 95 | Temp 97.9°F | Ht 70.0 in | Wt 206.4 lb

## 2021-11-09 DIAGNOSIS — R490 Dysphonia: Secondary | ICD-10-CM | POA: Diagnosis not present

## 2021-11-09 DIAGNOSIS — R070 Pain in throat: Secondary | ICD-10-CM

## 2021-11-09 MED ORDER — AZELASTINE HCL 0.1 % NA SOLN: NASAL | 6 refills | Status: DC

## 2021-11-09 NOTE — Progress Notes (Signed)
OFFICE NOTE  11/09/2021  CC:  Chief Complaint  Patient presents with   Throat pain    Pt states he is losing his voice since last OV. 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   HPI:   Patient is a 61 y.o. Caucasian male who is here for throat burning. I last saw him 10/19/21. A/P as of that visit: "1) acute sinusitis. Amoxicillin 875 twice daily x10 days. Continue saline nasal spray.  Encouraged smoking cessation.  2.  Rash on back--very subtle, possibly atypical pityriasis rosea that is clearing up. Pityriasis versicolor less likely. Continue observation at this time.   3.  Groin rash.  I think this is a separate issue from his back rash. Appears more like tinea cruris.  Recommend Lamisil cream twice daily.  Encouraged him to try to keep the area cool and dry."  INTERIM HX: Sinus symptoms cleared up well. Feels like his nose is stopped up around bedtime and he uses saline nasal spray lately. He wonders if this spray is what is making his throat burn.  Denies any postnasal drip or heartburn.  He lost his voice for the most part about a week ago, it has gradually returned but still gravelly today.  No problems with loss of voice prior to this. No fever.  Minimal cough productive of small amount of white sputum.  He does continue to smoke cigarettes.  Pertinent PMH:  Tobacco dependence. Hypercholesterolemia  MEDS;   Outpatient Medications Prior to Visit  Medication Sig Dispense Refill   acetaminophen (TYLENOL) 500 MG tablet Take 1,000 mg by mouth every 6 (six) hours as needed for moderate pain.     Aspirin-Salicylamide-Caffeine (ARTHRITIS STRENGTH BC POWDER PO) Take 1 Package by mouth daily as needed.     atorvastatin (LIPITOR) 40 MG tablet Take 1 tablet (40 mg total) by mouth daily. (Patient not taking: Reported on 10/19/2021) 30 tablet 2   meclizine (ANTIVERT) 25 MG tablet Take 1 tablet (25 mg total) by mouth 3 (three) times daily as needed for dizziness. (Patient not  taking: Reported on 09/09/2018) 30 tablet 0   No facility-administered medications prior to visit.    PE: Vitals with BMI 11/09/2021 10/19/2021 11/20/2018  Height 5\' 10"  5\' 10"  5\' 10"   Weight 206 lbs 6 oz 206 lbs 13 oz 206 lbs 6 oz  BMI 29.62 [id number] [id number]  Systolic [id number]  Diastolic 77 88 75  Pulse 95 73 74    VS: noted--normal. Gen: alert, NAD, NONTOXIC APPEARING. HEENT: eyes without injection, drainage, or swelling.  Ears: EACs clear, TMs with normal light reflex and landmarks.  Nose: No active rhinorrhea.  Mucosa with some dried, crusty exudate. No significant obstruction.  No purulent d/c.  No paranasal sinus TTP.  No facial swelling.  Throat and mouth without focal lesion.  No pharyngial swelling, erythema, or exudate.   Neck: supple, no LAD.   LUNGS: CTA bilat, nonlabored resps.   CV: RRR, no m/r/g. EXT: no c/c/e  IMPRESSION AND PLAN:  Throat burning, hoarseness, recent acute sinusitis. Sinusitis symptoms resolved.  Hoarseness is gradually improving. Throat appears normal today. I do not know if his saline nasal spray could be leading to his throat burning the next day or not.  Stop this spray. I sent in some Astelin nasal spray that he can use as needed for nasal congestion that he seems to get nightly lately.  An After Visit Summary was printed and given to the patient.  FOLLOW UP:  Return if symptoms worsen or fail to improve.  Signed:  Crissie Sickles, MD           11/09/2021

## 2023-07-14 ENCOUNTER — Encounter: Payer: Self-pay | Admitting: Family Medicine

## 2023-07-14 ENCOUNTER — Ambulatory Visit (INDEPENDENT_AMBULATORY_CARE_PROVIDER_SITE_OTHER): Payer: 59 | Admitting: Family Medicine

## 2023-07-14 VITALS — BP 147/96 | HR 84 | Ht 71.0 in | Wt 192.6 lb

## 2023-07-14 DIAGNOSIS — R053 Chronic cough: Secondary | ICD-10-CM | POA: Diagnosis not present

## 2023-07-14 DIAGNOSIS — Z0001 Encounter for general adult medical examination with abnormal findings: Secondary | ICD-10-CM

## 2023-07-14 DIAGNOSIS — F17200 Nicotine dependence, unspecified, uncomplicated: Secondary | ICD-10-CM | POA: Diagnosis not present

## 2023-07-14 DIAGNOSIS — K591 Functional diarrhea: Secondary | ICD-10-CM

## 2023-07-14 DIAGNOSIS — R634 Abnormal weight loss: Secondary | ICD-10-CM

## 2023-07-14 DIAGNOSIS — Z125 Encounter for screening for malignant neoplasm of prostate: Secondary | ICD-10-CM | POA: Diagnosis not present

## 2023-07-14 DIAGNOSIS — Z0000 Encounter for general adult medical examination without abnormal findings: Secondary | ICD-10-CM

## 2023-07-14 DIAGNOSIS — B360 Pityriasis versicolor: Secondary | ICD-10-CM

## 2023-07-14 LAB — CBC WITH DIFFERENTIAL/PLATELET
Basophils Absolute: 0.1 10*3/uL (ref 0.0–0.1)
Basophils Relative: 1.3 % (ref 0.0–3.0)
Eosinophils Absolute: 0.1 10*3/uL (ref 0.0–0.7)
Eosinophils Relative: 1.5 % (ref 0.0–5.0)
HCT: 51.1 % (ref 39.0–52.0)
Hemoglobin: 16.6 g/dL (ref 13.0–17.0)
Lymphocytes Relative: 32 % (ref 12.0–46.0)
Lymphs Abs: 2.1 10*3/uL (ref 0.7–4.0)
MCHC: 32.6 g/dL (ref 30.0–36.0)
MCV: 97 fL (ref 78.0–100.0)
Monocytes Absolute: 0.6 10*3/uL (ref 0.1–1.0)
Monocytes Relative: 8.6 % (ref 3.0–12.0)
Neutro Abs: 3.7 10*3/uL (ref 1.4–7.7)
Neutrophils Relative %: 56.6 % (ref 43.0–77.0)
Platelets: 270 10*3/uL (ref 150.0–400.0)
RBC: 5.27 Mil/uL (ref 4.22–5.81)
RDW: 14.2 % (ref 11.5–15.5)
WBC: 6.5 10*3/uL (ref 4.0–10.5)

## 2023-07-14 LAB — POCT URINALYSIS DIPSTICK
Bilirubin, UA: NEGATIVE
Blood, UA: NEGATIVE
Glucose, UA: NEGATIVE
Ketones, UA: NEGATIVE
Leukocytes, UA: NEGATIVE
Nitrite, UA: NEGATIVE
Protein, UA: NEGATIVE
Spec Grav, UA: 1.01 (ref 1.010–1.025)
Urobilinogen, UA: 0.2 U/dL
pH, UA: 6.5 (ref 5.0–8.0)

## 2023-07-14 LAB — COMPREHENSIVE METABOLIC PANEL
ALT: 17 U/L (ref 0–53)
AST: 25 U/L (ref 0–37)
Albumin: 4.6 g/dL (ref 3.5–5.2)
Alkaline Phosphatase: 52 U/L (ref 39–117)
BUN: 9 mg/dL (ref 6–23)
CO2: 27 meq/L (ref 19–32)
Calcium: 10 mg/dL (ref 8.4–10.5)
Chloride: 101 meq/L (ref 96–112)
Creatinine, Ser: 1.04 mg/dL (ref 0.40–1.50)
GFR: 76.43 mL/min (ref >60.00)
Glucose, Bld: 97 mg/dL (ref 70–99)
Potassium: 5.3 meq/L — ABNORMAL HIGH (ref 3.5–5.1)
Sodium: 136 meq/L (ref 135–145)
Total Bilirubin: 0.7 mg/dL (ref 0.2–1.2)
Total Protein: 8 g/dL (ref 6.0–8.3)

## 2023-07-14 LAB — LIPID PANEL
Cholesterol: 211 mg/dL — ABNORMAL HIGH (ref 0–200)
HDL: 70.3 mg/dL (ref >39.00)
LDL Cholesterol: 106 mg/dL — ABNORMAL HIGH (ref 0–99)
NonHDL: 140.32
Total CHOL/HDL Ratio: 3
Triglycerides: 174 mg/dL — ABNORMAL HIGH (ref 0.0–149.0)
VLDL: 34.8 mg/dL (ref 0.0–40.0)

## 2023-07-14 LAB — SEDIMENTATION RATE: Sed Rate: 33 mm/h — ABNORMAL HIGH (ref 0–20)

## 2023-07-14 LAB — PSA: PSA: 4.17 ng/mL — ABNORMAL HIGH (ref 0.10–4.00)

## 2023-07-14 MED ORDER — KETOCONAZOLE 2 % EX SHAM: MEDICATED_SHAMPOO | CUTANEOUS | 1 refills | Status: DC

## 2023-07-14 NOTE — Progress Notes (Signed)
Office Note 07/14/2023  CC:  Chief Complaint  Patient presents with   Annual Exam    Pt also mentions red spots that have been on his arms and back for about a year; pt states they swell when hot water is applied. Denies any irritation or pain.  Pt is fasting.     HPI:  Patient is a 62 y.o. male who is here for annual health maintenance exam. Has chronic cough productive of white sputum, says ever since having COVID last year. Some intermittent feelings of wheezing/shortness of breath.  Very brief. No chest pain. He does continue to smoke cigarettes.  Also having intermittent periods of urgent loose stool.  No blood in stool.  He thinks it may be more consistently after eating, particularly greasy/unhealthy foods. A bit crampy when this happens but otherwise no problem with abdominal pains.  Appetite is fine--"I eat when I am hungry". He has lost weight.  He is not trying to lose weight.  He says he does eat less over the summer because it is hotter but says 3 months ago he weighed 220 pounds anyways 192.6 today.  Past Medical History:  Diagnosis Date   CAP (community acquired pneumonia)    Dizziness    MRI brain normal 04/2018.  06/2018 ENT eval by Dr. Danelle Berry, possible positional vertigo, no further w/u recommended since pt was improving.   Hypercholesterolemia 08/2018   recommended statin 08/2018   Metatarsal fracture    multiple, R foot   Tobacco dependence    current as of 08/2018    Past Surgical History:  Procedure Laterality Date   APPENDECTOMY     early 76s   CARDIAC CAT[place]ATION     [place]approx in his early 30s --normal (for persistent CP).   COLONOSCOPY  2015-16   [facility]--normal screening.    Family History  Problem Relation Age of Onset   Diabetes Mother    Hypertension Mother    Cancer Sister     Social History   Socioeconomic History   Marital status: Legally Separated    Spouse name: Not on file   Number of  children: Not on file   Years of education: Not on file   Highest education level: Not on file  Occupational History   Not on file  Tobacco Use   Smoking status: Every Day    Current packs/day: 0.80    Average packs/day: 0.8 packs/day for 38.0 years (30.4 ttl pk-yrs)    Types: Cigarettes   Smokeless tobacco: Never  Vaping Use   Vaping status: Never Used  Substance and Sexual Activity   Alcohol use: Yes    Comment: rarely   Drug use: No   Sexual activity: Not on file  Other Topics Concern   Not on file  Social History Narrative   Married, separated 2019.  No children.   Educ: comm college   Occup: driver, Systems analyst to job sites.   Alcohol: 4-5 beers per week.   Tobacco: 55 pack yr hx--current as of 05/2018.   Drugs: occ marijuana, otherwise none.   Social Determinants of Health   Financial Resource Strain: Not on file  Food Insecurity: Not on file  Transportation Needs: Not on file  Physical Activity: Not on file  Stress: Not on file  Social Connections: Not on file  Intimate Partner Violence: Not on file    Outpatient Medications Prior to Visit  Medication Sig Dispense Refill   acetaminophen (TYLENOL) 500 MG tablet Take 1,000  mg by mouth every 6 (six) hours as needed for moderate pain. (Patient not taking: Reported on 07/14/2023)     Aspirin-Salicylamide-Caffeine (ARTHRITIS STRENGTH BC POWDER PO) Take 1 Package by mouth daily as needed. (Patient not taking: Reported on 07/14/2023)     azelastine (ASTELIN) 0.1 % nasal spray 2 sprays each nostril q12h prn nasal congestion (Patient not taking: Reported on 07/14/2023) 30 mL 6   No facility-administered medications prior to visit.    No Known Allergies  Review of Systems  Constitutional:  Negative for appetite change, chills, fatigue and fever.  HENT:  Negative for congestion, dental problem, ear pain and sore throat.   Eyes:  Negative for discharge, redness and visual disturbance.  Respiratory:  Positive for  cough. Negative for chest tightness, shortness of breath and wheezing.   Cardiovascular:  Negative for chest pain, palpitations and leg swelling.  Gastrointestinal:  Positive for diarrhea. Negative for abdominal pain, blood in stool, nausea and vomiting.  Genitourinary:  Negative for difficulty urinating, dysuria, flank pain, frequency, hematuria and urgency.  Musculoskeletal:  Negative for arthralgias, back pain, joint swelling, myalgias and neck stiffness.  Skin:  Negative for pallor and rash.  Neurological:  Negative for dizziness, speech difficulty, weakness and headaches.  Hematological:  Negative for adenopathy. Does not bruise/bleed easily.  Psychiatric/Behavioral:  Negative for confusion and sleep disturbance. The patient is not nervous/anxious.     PE;    07/14/2023    9:21 AM 11/09/2021    1:26 PM 10/19/2021   10:12 AM  Vitals with BMI  Height 5\' 11"  5\' 10"  5\' 10"   Weight 192 lbs 10 oz 206 lbs 6 oz 206 lbs 13 oz  BMI 26.87 29.62 29.67  Systolic 147 122 295  Diastolic 96 77 88  Pulse 84 95 73    Gen: Alert, well appearing.  Patient is oriented to person, place, time, and situation. AFFECT: pleasant, lucid thought and speech. ENT: Ears: EACs clear, normal epithelium.  TMs with good light reflex and landmarks bilaterally.  Eyes: no injection, icteris, swelling, or exudate.  EOMI, PERRLA. Nose: no drainage or turbinate edema/swelling.  No injection or focal lesion.  Mouth: lips without lesion/swelling.  Oral mucosa pink and moist.  Dentition intact and without obvious caries or gingival swelling.  Oropharynx without erythema, exudate, or swelling.  Neck: supple/nontender.  No LAD, mass, or TM.  Carotid pulses 2+ bilaterally, without bruits. CV: RRR, no m/r/g.   LUNGS: CTA bilat, nonlabored resps, good aeration in all lung fields. ABD: soft, NT, ND, BS normal.  No hepatospenomegaly or mass.  No bruits. EXT: no clubbing, cyanosis, or edema.  Musculoskeletal: no joint swelling,  erythema, warmth, or tenderness.  ROM of all joints intact. Skin -scattered light pink/almost flesh-colored macules about 3 to 4 mm up to 1-1/2 cm in size.  Mostly on the arms and generalized over the mid back.  Very fine superficial flaking noted.  No hives, no vesicles or pustules.   Pertinent labs:  Lab Results  Component Value Date   TSH 1.52 09/09/2018   Lab Results  Component Value Date   WBC 6.9 09/09/2018   HGB 16.6 09/09/2018   HCT 49.7 09/09/2018   MCV 90.4 09/09/2018   PLT 346.0 09/09/2018   Lab Results  Component Value Date   CREATININE 1.02 09/09/2018   BUN 12 09/09/2018   NA 139 09/09/2018   K 4.6 09/09/2018   CL 104 09/09/2018   CO2 28 09/09/2018   Lab Results  Component  Value Date   ALT 11 09/09/2018   AST 14 09/09/2018   ALKPHOS 57 09/09/2018   BILITOT 0.4 09/09/2018   Lab Results  Component Value Date   CHOL 204 (H) 09/09/2018   Lab Results  Component Value Date   HDL 45.90 09/09/2018   Lab Results  Component Value Date   LDLCALC 123 (H) 09/09/2018   Lab Results  Component Value Date   TRIG 175.0 (H) 09/09/2018   Lab Results  Component Value Date   CHOLHDL 4 09/09/2018   Lab Results  Component Value Date   PSA 1.65 09/09/2018   ASSESSMENT AND PLAN:   #1 health maintenance exam: Reviewed age and gender appropriate health maintenance issues (prudent diet, regular exercise, health risks of tobacco and excessive alcohol, use of seatbelts, fire alarms in home, use of sunscreen).  Also reviewed age and gender appropriate health screening as well as vaccine recommendations. Vaccines: flu vaccine--> pt declined.  Prevnar--> declined.   Shingrix vaccine-->pt declined. Labs: fasting HP + PSA. Prostate ca screening: PSA. Colon ca screening: next colonoscopy due 2026.  #2 abnormal weight loss. Check CBC, c-Met, TSH, sed rate, and order CT chest abdomen pelvis with contrast. (POC UA today normal).  #3 tobacco dependence, chronic cough.   Suspect he has COPD. See #2 above.  #4 tinea versicolor.  Ketoconazole shampoo was prescribed today to lather on the areas on arms and back 3 times per week.  An After Visit Summary was printed and given to the patient.  FOLLOW UP:  Return for 2 to 3 weeks follow-up weight loss.  Signed:  Santiago Bumpers, MD           07/14/2023

## 2023-07-15 ENCOUNTER — Telehealth: Payer: Self-pay

## 2023-07-15 ENCOUNTER — Encounter: Payer: Self-pay | Admitting: Family Medicine

## 2023-07-15 ENCOUNTER — Other Ambulatory Visit: Payer: Self-pay

## 2023-07-15 DIAGNOSIS — E7800 Pure hypercholesterolemia, unspecified: Secondary | ICD-10-CM

## 2023-07-15 MED ORDER — ATORVASTATIN CALCIUM 20 MG PO TABS: 20.0000 mg | ORAL_TABLET | Freq: Every day | ORAL | 2 refills | Status: DC

## 2023-07-15 NOTE — Telephone Encounter (Signed)
Sent RX to [facility] [place] and canceled previous order to [facility].

## 2023-07-15 NOTE — Telephone Encounter (Signed)
Patient just spoke to someone about test results and was calling in meds to pharmacy. Per patient's insurance - patient has to use [facility] [place].  Please send prescription to [facility] [place] and please change preferred pharmacy.

## 2023-07-17 ENCOUNTER — Ambulatory Visit (INDEPENDENT_AMBULATORY_CARE_PROVIDER_SITE_OTHER): Payer: 59 | Admitting: Family Medicine

## 2023-07-17 ENCOUNTER — Telehealth: Payer: Self-pay

## 2023-07-17 ENCOUNTER — Encounter: Payer: Self-pay | Admitting: Family Medicine

## 2023-07-17 VITALS — BP 142/86 | HR 95 | Wt 194.6 lb

## 2023-07-17 DIAGNOSIS — R002 Palpitations: Secondary | ICD-10-CM

## 2023-07-17 DIAGNOSIS — T50905A Adverse effect of unspecified drugs, medicaments and biological substances, initial encounter: Secondary | ICD-10-CM | POA: Diagnosis not present

## 2023-07-17 NOTE — Telephone Encounter (Signed)
Pt has appt today, 10/17

## 2023-07-17 NOTE — Progress Notes (Signed)
OFFICE VISIT  07/17/2023  CC:  Chief Complaint  Patient presents with   Medication Reaction    Pt states that since starting Atorvastatin he has experienced intermittent heart fluttering as well as dizziness, headache, and blurred vision. Pt took med this morning.     Patient is a 62 y.o. male who presents for palpitations.  HPI: I saw him for his health maintenance exam 3 days ago.  Labs from that visit showed LDL of 106. I started him on a atorvastatin 20 mg a day at that time. The first day he took this he started to feel a sense of fluttering in his chest, "like you are going over the hill quickly in a car".  Intermittent feeling of dizziness.  Says left eye was blurry for a while.  No chest pain, focal weakness, paresthesia, or rash.  No muscle aches or joint aches. He has taken the medication each of the last 4 days, including today.  The symptoms have waxed and waned during this time.   Past Medical History:  Diagnosis Date   CAP (community acquired pneumonia)    Dizziness    MRI brain normal 04/2018.  06/2018 ENT eval by Dr. Danelle Berry, possible positional vertigo, no further w/u recommended since pt was improving.   Elevated PSA    mild 07/2023   Hypercholesterolemia 08/2018   recommended statin 08/2018   Metatarsal fracture    multiple, R foot   Tobacco dependence    current as of 08/2018    Past Surgical History:  Procedure Laterality Date   APPENDECTOMY     early 73s   [place]ARDI[place]ATION     [place]approx in his early 30s --normal (for persistent CP).   COLONOSCOPY  2015-16   [facility]--normal screening.    Outpatient Medications Prior to Visit  Medication Sig Dispense Refill   atorvastatin (LIPITOR) 20 MG tablet Take 1 tablet (20 mg total) by mouth daily. 30 tablet 2   ketoconazole (NIZORAL) 2 % shampoo Apply to affected area 3 times a week 360 mL 1   acetaminophen (TYLENOL) 500 MG tablet Take 1,000 mg by mouth every 6 (six) hours  as needed for moderate pain. (Patient not taking: Reported on 07/14/2023)     Aspirin-Salicylamide-Caffeine (ARTHRITIS STRENGTH BC POWDER PO) Take 1 Package by mouth daily as needed. (Patient not taking: Reported on 07/14/2023)     azelastine (ASTELIN) 0.1 % nasal spray 2 sprays each nostril q12h prn nasal congestion (Patient not taking: Reported on 07/14/2023) 30 mL 6   No facility-administered medications prior to visit.    No Known Allergies  Review of Systems  As per HPI  PE:    07/17/2023    4:01 PM 07/17/2023    3:56 PM 07/14/2023    9:21 AM  Vitals with BMI  Height   5\' 11"   Weight  194 lbs 10 oz 192 lbs 10 oz  BMI  27.15 26.87  Systolic 142 153 469  Diastolic 86 82 96  Pulse  95 84     Physical Exam  Gen: Alert, well appearing.  Patient is oriented to person, place, time, and situation. AFFECT: pleasant, lucid thought and speech. GEX:BMWU: no injection, icteris, swelling, or exudate.  EOMI, PERRLA. Mouth: lips without lesion/swelling.  Oral mucosa pink and moist. Oropharynx without erythema, exudate, or swelling.  CV: RRR, no m/r/g.   LUNGS: CTA bilat, nonlabored resps, good aeration in all lung fields. Neuro: CN 2-12 intact bilaterally, strength 5/5 in proximal and  distal upper extremities and lower extremities bilaterally.   No tremor.  No disdiadochokinesis.  No ataxia.  Upper extremity and lower extremity DTRs symmetric.  No pronator drift.   LABS:  Last CBC Lab Results  Component Value Date   WBC 6.5 07/14/2023   HGB 16.6 07/14/2023   HCT 51.1 07/14/2023   MCV 97.0 07/14/2023   MCH 29.3 05/13/2018   RDW 14.2 07/14/2023   PLT 270.0 07/14/2023   Last metabolic panel Lab Results  Component Value Date   GLUCOSE 97 07/14/2023   NA 136 07/14/2023   K 5.3 (H) 07/14/2023   CL 101 07/14/2023   CO2 27 07/14/2023   BUN 9 07/14/2023   CREATININE 1.04 07/14/2023   GFR 76.43 07/14/2023   CALCIUM 10.0 07/14/2023   PROT 8.0 07/14/2023   ALBUMIN 4.6  07/14/2023   BILITOT 0.7 07/14/2023   ALKPHOS 52 07/14/2023   AST 25 07/14/2023   ALT 17 07/14/2023   ANIONGAP 11 05/13/2018   Last lipids Lab Results  Component Value Date   CHOL 211 (H) 07/14/2023   HDL 70.30 07/14/2023   LDLCALC 106 (H) 07/14/2023   TRIG 174.0 (H) 07/14/2023   CHOLHDL 3 07/14/2023   Last thyroid functions Lab Results  Component Value Date   TSH 1.52 09/09/2018   IMPRESSION AND PLAN:  #1 medication side effect-- atorvastatin. Discontinue medication. He will call or return if all symptom is not gone in 2-3 days.  #2 Elev PSA-->?recheck 6 mo vs ref urol. He is aware.  No decision made yet.  An After Visit Summary was printed and given to the patient.  FOLLOW UP: Return for Keep plan for follow-up in 08/08/2023.  Signed:  Santiago Bumpers, MD           07/17/2023

## 2023-07-17 NOTE — Telephone Encounter (Signed)
Patient said that he started on new cholesterol medication last week. He is unsure if this is med reaction or not. He is having shortness of breath and chest pain.  Transferred patient to triage.

## 2023-07-21 NOTE — Telephone Encounter (Signed)
[facility] Day - Client TELEPHONE ADVICE RECORD AccessNurse Patient Name First: Roger Last: Carrillo Gender: Male DOB: 03/02/1961 Age: 62 Y 5 M 25 D Return Phone Number: [phone] (Primary) Address: City/ State/ Zip: [place]  [address] Client [facility] Day - Client Client Site Center [facility] - Day Provider Santiago Bumpers - MD Contact Type Call Who Is Calling Patient / Member / Family / Caregiver Call Type Triage / Clinical Relationship To Patient Self Return Phone Number [phone] (Primary) Chief Complaint CHEST PAIN - pain, pressure, heaviness or tightness Reason for Call Symptomatic / Request for Health Information Initial Comment Caller was in the office on Friday and the cholesterol medication is causing chest pains and light headed. Translation No Nurse Assessment Nurse: Mayford Knife, RN, Lupe Carney Date/Time Lamount Cohen Time): 07/17/2023 9:33:31 AM Confirm and document reason for call. If symptomatic, describe symptoms. ---Chest pains, dizziness, heart flutters. Started cholesterol medication on Friday. Does the patient have any new or worsening symptoms? ---Yes Will a triage be completed? ---Yes Related visit to physician within the last 2 weeks? ---Yes Does the PT have any chronic conditions? (i.e. diabetes, asthma, this includes High risk factors for pregnancy, etc.) ---Yes List chronic conditions. ---high cholesterol Is this a behavioral health or substance abuse call? ---No Guidelines Guideline Title Affirmed Question Affirmed Notes Nurse Date/Time (Eastern Time) Heart Rate and Heartbeat Questions History of heart disease (i.e., heart attack, bypass surgery, angina, angioplasty, CHF) (Exception: Brief heartbeat symptoms that went away and now feels well.) Mayford Knife, RN, Lupe Carney 07/17/2023 9:34:45 AM PLEASE NOTE: All timestamps contained within this report are represented as [place] Standard  Time. CONFIDENTIALTY NOTICE: This fax transmission is intended only for the addressee. It contains information that is legally privileged, confidential or otherwise protected from use or disclosure. If you are not the intended recipient, you are strictly prohibited from reviewing, disclosing, copying using or disseminating any of this information or taking any action in reliance on or regarding this information. If you have received this fax in error, please notify us immediately by telephone so that we can arrange for its return to [place]. Phone: [phone], Toll-Free: [phone], Fax: [phone] Page: 2 of 2 Call Id: [id number] Disp. Time Lamount Cohen Time) Disposition Final User 07/17/2023 9:31:28 AM Send to Urgent Michela Pitcher, Lanette 07/17/2023 9:43:52 AM See HCP within 4 Hours (or PCP triage) Yes Mayford Knife, RN, Lupe Carney Final Disposition 07/17/2023 9:43:52 AM See HCP within 4 Hours (or PCP triage) Yes Mayford Knife, RN, Lupe Carney Caller Disagree/Comply Comply Caller Understands Yes PreDisposition Call Doctor Care Advice Given Per Guideline SEE HCP (OR PCP TRIAGE) WITHIN 4 HOURS: * IF OFFICE WILL BE OPEN: You need to be seen within the next 3 or 4 hours. Call your doctor (or NP/PA) now or as soon as the office opens. CALL BACK IF: * You become worse CARE ADVICE given per Heart Rate and Heartbeat Questions (Adult) guideline. Comments User: Hadley Pen, RN Date/Time Lamount Cohen Time): 07/17/2023 9:44:49 AM Was able to warm transfer for same day appointment. Caller is currently working and unable to be seen urgently. Referrals REFERRED TO PCP OFFICE

## 2023-08-08 ENCOUNTER — Ambulatory Visit: Payer: 59 | Admitting: Family Medicine

## 2023-08-08 NOTE — Progress Notes (Deleted)
OFFICE VISIT  08/08/2023  CC: No chief complaint on file.   Patient is a 62 y.o. male who presents for 3 week follow-up abnormal weight loss. A/P as of last visit: "Abnormal weight loss. Check CBC, c-Met, TSH, sed rate, and order CT chest abdomen pelvis with contrast. (POC UA today normal)."  INTERIM HX: ***  CT of chest abdomen and pelvis is not scheduled.   (Has chronic cough productive of white sputum, says ever since having COVID last year. Some intermittent feelings of wheezing/shortness of breath.  Very brief. No chest pain. He does continue to smoke cigarettes.)   Past Medical History:  Diagnosis Date   CAP (community acquired pneumonia)    Dizziness    MRI brain normal 04/2018.  06/2018 ENT eval by Dr. Danelle Berry, possible positional vertigo, no further w/u recommended since pt was improving.   Elevated PSA    mild 07/2023   Hypercholesterolemia 08/2018   recommended statin 08/2018   Metatarsal fracture    multiple, R foot   Tobacco dependence    current as of 08/2018    Past Surgical History:  Procedure Laterality Date   APPENDECTOMY     early 93s   CARDIAC [place]HETERIZATION     [place]approx in his early 30s --normal (for persistent CP).   COLONOSCOPY  2015-16   [facility]--normal screening.    Outpatient Medications Prior to Visit  Medication Sig Dispense Refill   acetaminophen (TYLENOL) 500 MG tablet Take 1,000 mg by mouth every 6 (six) hours as needed for moderate pain. (Patient not taking: Reported on 07/14/2023)     Aspirin-Salicylamide-Caffeine (ARTHRITIS STRENGTH BC POWDER PO) Take 1 Package by mouth daily as needed. (Patient not taking: Reported on 07/14/2023)     atorvastatin (LIPITOR) 20 MG tablet Take 1 tablet (20 mg total) by mouth daily. 30 tablet 2   azelastine (ASTELIN) 0.1 % nasal spray 2 sprays each nostril q12h prn nasal congestion (Patient not taking: Reported on 07/14/2023) 30 mL 6   ketoconazole (NIZORAL) 2 %  shampoo Apply to affected area 3 times a week 360 mL 1   No facility-administered medications prior to visit.    Allergies  Allergen Reactions   Atorvastatin Palpitations    Review of Systems As per HPI  PE:    07/17/2023    4:01 PM 07/17/2023    3:56 PM 07/14/2023    9:21 AM  Vitals with BMI  Height   5\' 11"   Weight  194 lbs 10 oz 192 lbs 10 oz  BMI  27.15 26.87  Systolic 142 153 161  Diastolic 86 82 96  Pulse  95 84     Physical Exam  ***  LABS:  Last CBC Lab Results  Component Value Date   WBC 6.5 07/14/2023   HGB 16.6 07/14/2023   HCT 51.1 07/14/2023   MCV 97.0 07/14/2023   MCH 29.3 05/13/2018   RDW 14.2 07/14/2023   PLT 270.0 07/14/2023   Last metabolic panel Lab Results  Component Value Date   GLUCOSE 97 07/14/2023   NA 136 07/14/2023   K 5.3 (H) 07/14/2023   CL 101 07/14/2023   CO2 27 07/14/2023   BUN 9 07/14/2023   CREATININE 1.04 07/14/2023   GFR 76.43 07/14/2023   CALCIUM 10.0 07/14/2023   PROT 8.0 07/14/2023   ALBUMIN 4.6 07/14/2023   BILITOT 0.7 07/14/2023   ALKPHOS 52 07/14/2023   AST 25 07/14/2023   ALT 17 07/14/2023   ANIONGAP 11 05/13/2018  Last hemoglobin A1c No results found for: "HGBA1C" Last thyroid functions Lab Results  Component Value Date   TSH 1.52 09/09/2018   Lab Results  Component Value Date   ESRSEDRATE 33 (H) 07/14/2023   Lab Results  Component Value Date   PSA 4.17 (H) 07/14/2023   PSA 1.65 09/09/2018   IMPRESSION AND PLAN:  No problem-specific Assessment & Plan notes found for this encounter.   An After Visit Summary was printed and given to the patient.  FOLLOW UP: No follow-ups on file.  Signed:  Santiago Bumpers, MD           08/08/2023

## 2023-08-24 ENCOUNTER — Ambulatory Visit (HOSPITAL_COMMUNITY): Payer: 59

## 2023-08-25 ENCOUNTER — Ambulatory Visit (HOSPITAL_COMMUNITY): Payer: 59

## 2023-09-02 ENCOUNTER — Encounter: Payer: Self-pay | Admitting: Family Medicine

## 2023-09-03 ENCOUNTER — Ambulatory Visit (HOSPITAL_COMMUNITY)
Admission: RE | Admit: 2023-09-03 | Discharge: 2023-09-03 | Disposition: A | Discharge status: Home / Self Care | Payer: 59 | Source: Ambulatory Visit | Attending: Family Medicine | Admitting: Family Medicine

## 2023-09-03 DIAGNOSIS — F17200 Nicotine dependence, unspecified, uncomplicated: Secondary | ICD-10-CM

## 2023-09-03 DIAGNOSIS — R053 Chronic cough: Secondary | ICD-10-CM | POA: Diagnosis present

## 2023-09-03 DIAGNOSIS — K591 Functional diarrhea: Secondary | ICD-10-CM | POA: Diagnosis present

## 2023-09-03 DIAGNOSIS — R634 Abnormal weight loss: Secondary | ICD-10-CM | POA: Diagnosis present

## 2023-09-03 MED ORDER — IOHEXOL 300 MG/ML  SOLN
100.0000 mL | Freq: Once | INTRAMUSCULAR | Status: AC | PRN
  Administered 2023-09-03: 100 mL via INTRAVENOUS

## 2023-09-03 MED ORDER — IOHEXOL 350 MG/ML SOLN: 100.0000 mL | Freq: Once | INTRAVENOUS | Status: DC | PRN

## 2023-09-09 ENCOUNTER — Telehealth: Payer: Self-pay | Admitting: Family Medicine

## 2023-09-09 NOTE — Telephone Encounter (Signed)
Patient called to inquire about CT scan results. Please give the patient a call once its been read. He had this done on 12/4.

## 2023-09-09 NOTE — Telephone Encounter (Signed)
FYI. I have called the reading room so they can send results

## 2023-09-10 ENCOUNTER — Other Ambulatory Visit: Payer: Self-pay | Admitting: Family Medicine

## 2023-09-10 NOTE — Telephone Encounter (Signed)
See result note attached to his CT scan.

## 2023-09-11 ENCOUNTER — Other Ambulatory Visit: Payer: Self-pay

## 2023-09-11 DIAGNOSIS — R634 Abnormal weight loss: Secondary | ICD-10-CM

## 2023-09-11 DIAGNOSIS — R972 Elevated prostate specific antigen [PSA]: Secondary | ICD-10-CM

## 2023-09-11 NOTE — Telephone Encounter (Signed)
Pt advised of results and referral placed for [facility]

## 2023-09-12 ENCOUNTER — Encounter: Payer: Self-pay | Admitting: Family Medicine

## 2023-10-04 ENCOUNTER — Encounter: Payer: Self-pay | Admitting: Family Medicine

## 2024-02-04 ENCOUNTER — Encounter: Payer: Self-pay | Admitting: Family Medicine

## 2024-02-04 ENCOUNTER — Ambulatory Visit: Payer: Self-pay

## 2024-02-04 ENCOUNTER — Ambulatory Visit: Admitting: Family Medicine

## 2024-02-04 VITALS — BP 133/79 | HR 85 | Temp 98.1°F | Ht 71.0 in | Wt 202.2 lb

## 2024-02-04 DIAGNOSIS — M25512 Pain in left shoulder: Secondary | ICD-10-CM | POA: Diagnosis not present

## 2024-02-04 DIAGNOSIS — M25812 Other specified joint disorders, left shoulder: Secondary | ICD-10-CM | POA: Diagnosis not present

## 2024-02-04 DIAGNOSIS — M7552 Bursitis of left shoulder: Secondary | ICD-10-CM

## 2024-02-04 DIAGNOSIS — G8929 Other chronic pain: Secondary | ICD-10-CM

## 2024-02-04 DIAGNOSIS — R972 Elevated prostate specific antigen [PSA]: Secondary | ICD-10-CM

## 2024-02-04 DIAGNOSIS — Z125 Encounter for screening for malignant neoplasm of prostate: Secondary | ICD-10-CM

## 2024-02-04 MED ORDER — HYDROCODONE-ACETAMINOPHEN 5-325 MG PO TABS: ORAL_TABLET | ORAL | 0 refills | Status: DC

## 2024-02-04 NOTE — Telephone Encounter (Signed)
No further action needed, pt scheduled

## 2024-02-04 NOTE — Telephone Encounter (Signed)
 Copied from CRM [id number]. Topic: Clinical - Red Word Triage >> Feb 04, 2024  9:46 AM Alethia Huxley E wrote: [facility] that prompted transfer to Nurse Triage: Rotator cuff pain. Patient stated he has been having pain in his left rotator cuff where he cannot sleep as the pain gets to a level 10 out of 10. Pain has been going on for 2 months.    Chief Complaint: Left Shoulder Pain (patient states in the rotator cuff area mostly) Symptoms: pain Frequency: 2 months Pertinent Negatives: Patient denies neck pain, swelling, rash, fever, numbness, weakness, injuries, chest pain, difficulty breathing, abdominal pain Disposition: [] ED /[] Urgent Care (no appt availability in office) / [x] Appointment(In office/virtual)/ []  [facility]/ [] Home Care/ [] Refused Recommended Disposition /[] [facility]/ []  Follow-up with PCP Additional Notes: Patient called and advised that he has been having left shoulder pain for the past two months.  He states the pain is localized to the rotator cuff area and the pain gets a lot worse if he tries to lift his arm.  He states certain movements exacerbate the pain. Patient uses his arm a lot at work and can't pinpoint an exact injury but states that he thinks he may have strained something in the rotator cuff.  Patient denies neck pain, swelling, rash, fever, numbness, weakness, injuries, chest pain, difficulty breathing, abdominal pain.  Appointment is made for today 02/04/2024 at 1:20 PM with patient's PCP.  Patient is given Care Advice as per protocol and patient is also advised that if anything gets worse to go to the Emergency Room. Patient verbalized understanding.   Reason for Disposition  [1] MODERATE pain (e.g., interferes with normal activities) AND [2] present > 3 days  Answer Assessment - Initial Assessment Questions 1. ONSET: "When did the pain start?"     About 2 months 2. LOCATION: "Where is the pain located?"     Left shoulder in the area of  the rotator 3. PAIN: "How bad is the pain?" (Scale 1-10; or mild, moderate, severe)   - MILD (1-3): doesn't interfere with normal activities   - MODERATE (4-7): interferes with normal activities (e.g., work or school) or awakens from sleep   - SEVERE (8-10): excruciating pain, unable to do any normal activities, unable to move arm at all due to pain     "If I lift my arm up, it tears me up" 4. WORK OR EXERCISE: "Has there been any recent work or exercise that involved this part of the body?"     Daily use 5. CAUSE: "What do you think is causing the shoulder pain?"     unknown 6. OTHER SYMPTOMS: "Do you have any other symptoms?" (e.g., neck pain, swelling, rash, fever, numbness, weakness)    No  Protocols used: Shoulder Pain-A-AH

## 2024-02-04 NOTE — Progress Notes (Signed)
 OFFICE VISIT  02/04/2024  CC:  Chief Complaint  Patient presents with   Shoulder Pain    Left, rotator cuff area x 2 months; worse with movement, unable to completely lift arm all the way    Patient is a 63 y.o. male who presents for left shoulder pain.  HPI: Onset 2 months ago spontaneously, woke up 1 morning and says left shoulder hurt.  Has progressed in severity especially over the last week.  He is unable to sleep due to the pain.  Hurts the worst when he reaches outward and upward. He did not have any preceding injury.  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 The pain extends across the left trapezius/supraspinatus muscle region up into the soft tissues of the left side of the neck.  The pain occasionally extends down to about the elbow level.  No shooting pain past the elbow or into the hand.  No paresthesias.  No arm weakness.  He is at the point where he has to do everything right handed. He is taking 4 Aleve and 4 Tylenol  every day, not getting any significant relief.  PMP AWARE reviewed today: no rx's filled  No red flags.  Review of systems: No other joint pains.  No fevers. No rash.  Past Medical History:  Diagnosis Date   Avascular necrosis of femoral head (HCC)    bilat, w/out collapse, on CT 2024   CAP (community acquired pneumonia)    Dizziness    MRI brain normal 04/2018.  06/2018 ENT eval by Dr. Deleta Felix, possible positional vertigo, no further w/u recommended since pt was improving.   Elevated PSA    mild 07/2023   Hypercholesterolemia 08/2018   recommended statin 08/2018   Metatarsal fracture    multiple, R foot   Tobacco dependence    current as of 08/2018    Past Surgical History:  Procedure Laterality Date   APPENDECTOMY     early 13s   CARDIAC CATHE[place]IZATION     [place]approx in his early 30s --normal (for persistent CP).   COLONOSCOPY     04/2013 [facility]--normal screening.    Outpatient Medications Prior to Visit  Medication Sig Dispense Refill   acetaminophen  (TYLENOL ) 500 MG tablet Take 1,000 mg by mouth every 6 (six) hours as needed for moderate pain (pain score 4-6).     Naproxen Sodium (ALEVE PO) Take by mouth as needed.     azelastine  (ASTELIN ) 0.1 % nasal spray 2 sprays each nostril q12h prn nasal congestion (Patient not taking: Reported on 02/04/2024) 30 mL 6   Aspirin-Salicylamide-Caffeine (ARTHRITIS STRENGTH BC POWDER PO) Take 1 Package by mouth daily as needed. (Patient not taking: Reported on 02/04/2024)     ketoconazole  (NIZORAL ) 2 % shampoo Apply to affected area 3 times a week (Patient not taking: Reported on 02/04/2024) 360 mL 1   No facility-administered medications prior to visit.    Allergies  Allergen Reactions   Atorvastatin  Palpitations    Review of Systems  As per HPI  PE:    02/04/2024    1:12 PM 07/17/2023    4:01 PM 07/17/2023    3:56 PM  Vitals with BMI  Height 5\' 11"     Weight 202 lbs 3 oz  194 lbs 10 oz  BMI 28.21  27.15  Systolic 133 142 725  Diastolic 79 86 82  Pulse 85  95  Physical Exam  Gen: Alert, well appearing.  Patient is oriented to person, place, time, and situation. Left shoulder with mild tenderness to palpation over the [facility] joint.  He has significantly more tenderness to palpation around the lateral and posterior aspect of the acromion.  Mild tenderness to palpation over the trapezius and supraspinatus regions on the left.  Some discomfort to palpation of the left sided neck muscles up into the occiput region.   Speeds testing elicits the pain over the posterolateral acromion but not in the anterior shoulder.  He is unable to do the full Hawkins or O'Briens or scarf sign testing--limited by pain.  External rotation elicits the pain more than internal rotation. Negative Popeye sign.  Yergason's negative. Strength proximally and distally in the left arm are normal. No sensory  abnormalities.  LABS:  Last metabolic panel Lab Results  Component Value Date   GLUCOSE 97 07/14/2023   NA 136 07/14/2023   K 5.3 (H) 07/14/2023   CL 101 07/14/2023   CO2 27 07/14/2023   BUN 9 07/14/2023   CREATININE 1.04 07/14/2023   GFR 76.43 07/14/2023   CALCIUM  10.0 07/14/2023   PROT 8.0 07/14/2023   ALBUMIN 4.6 07/14/2023   BILITOT 0.7 07/14/2023   ALKPHOS 52 07/14/2023   AST 25 07/14/2023   ALT 17 07/14/2023   ANIONGAP 11 05/13/2018     Lab Results  Component Value Date   PSA 4.17 (H) 07/14/2023   PSA 1.65 09/09/2018   IMPRESSION AND PLAN:  #1 subacute left shoulder pain. Exam consistent with rotator cuff impingement/subacromial bursitis. Low suspicion of cervical radiculopathy.  Bedside [facility] ultrasound today: Biceps tendon intact and long and short views.  No effusion in biceps sheath. Normal subscap.  AC joint with some degenerative changes and mild distention of the capsule but no geyser sign. Supraspinatus tendon with some generalized thickening but normal echogenicity, no tears.  No definite impingement on dynamic imaging of the supraspinatus.  Small amount of fluid in subacromial/subdeltoid bursa.  Posterior glenohumeral joint appears normal, infraspinatus appears normal.  Will get radiographs of left shoulder. Recommended physical therapy but patient unsure if he will be able to get this through his insurance, also may have some work scheduling conflicts. Stop all over-the-counter pain meds. I prescribed Vicodin 5/325, 1-2 twice daily as needed, #30. The emphasis is to take these at bedtime to try to help him get enough pain relief for sleep. He declined steroid injection today.  Depending on his x-ray results and whether or not he can do PT we may have to move on with MRI and/or orthopedics referral.  #2 elevated PSA (07/2023 screening). He was unable to see the urologist because he states their office would not accept his insurance. Will repeat PSA  today.  An After Visit Summary was printed and given to the patient.  FOLLOW UP: Return in about 3 weeks (around 02/25/2024) for f/u left shoulder pain.  Signed:  Arletha Lady, MD           02/04/2024

## 2024-02-05 LAB — PSA: PSA: 2.96 ng/mL (ref 0.10–4.00)

## 2024-02-24 ENCOUNTER — Telehealth: Payer: Self-pay

## 2024-02-24 NOTE — Telephone Encounter (Signed)
 Copied from CRM [id number]. Topic: General - Other >> Feb 24, 2024  9:20 AM Annelle Kiel wrote: Reason for CRM: patient is needing a xray of shoulder he has not gotten a call back yet regarding this he would like a call back today.  Patient will need an appointment, he can call [phone] for [facility]

## 2024-02-24 NOTE — Telephone Encounter (Signed)
 Pt advised of imaging information to schedule.

## 2024-02-26 ENCOUNTER — Encounter: Payer: Self-pay | Admitting: Family Medicine

## 2024-02-26 ENCOUNTER — Ambulatory Visit: Admitting: Family Medicine

## 2024-02-26 ENCOUNTER — Ambulatory Visit
Admission: RE | Admit: 2024-02-26 | Discharge: 2024-02-26 | Disposition: A | Discharge status: Home / Self Care | Source: Ambulatory Visit | Attending: Family Medicine | Admitting: Family Medicine

## 2024-02-26 VITALS — BP 146/83 | HR 86 | Temp 98.3°F | Ht 71.0 in | Wt 203.2 lb

## 2024-02-26 DIAGNOSIS — M25512 Pain in left shoulder: Secondary | ICD-10-CM

## 2024-02-26 DIAGNOSIS — G8929 Other chronic pain: Secondary | ICD-10-CM

## 2024-02-26 DIAGNOSIS — M7542 Impingement syndrome of left shoulder: Secondary | ICD-10-CM

## 2024-02-26 MED ORDER — HYDROCODONE-ACETAMINOPHEN 5-325 MG PO TABS: ORAL_TABLET | ORAL | 0 refills | Status: DC

## 2024-02-26 NOTE — Progress Notes (Signed)
 OFFICE VISIT  02/26/2024  CC:  Chief Complaint  Patient presents with   Follow-up    Left shoulder pain; completed XR today    Patient is a 63 y.o. male who presents for 3-week follow-up left shoulder pain. A/P as of last visit: "#1 subacute left shoulder pain. Exam consistent with rotator cuff impingement/subacromial bursitis. Low suspicion of cervical radiculopathy.   Bedside [facility] ultrasound today: Biceps tendon intact and long and short views.  No effusion in biceps sheath. Normal subscap.  AC joint with some degenerative changes and mild distention of the capsule but no geyser sign. Supraspinatus tendon with some generalized thickening but normal echogenicity, no tears.  No definite impingement on dynamic imaging of the supraspinatus.  Small amount of fluid in subacromial/subdeltoid bursa.  Posterior glenohumeral joint appears normal, infraspinatus appears normal.   Will get radiographs of left shoulder. Recommended physical therapy but patient unsure if he will be able to get this through his insurance, also may have some work scheduling conflicts. Stop all over-the-counter pain meds. I prescribed Vicodin 5/325, 1-2 twice daily as needed, #30. The emphasis is to take these at bedtime to try to help him get enough pain relief for sleep. He declined steroid injection today.   Depending on his x-ray results and whether or not he can do PT we may have to move on with MRI and/or orthopedics referral.   #2 elevated PSA (07/2023 screening). He was unable to see the urologist because he states their office would not accept his insurance. Will repeat PSA today."  INTERIM HX: Shoulder pain still severe.  He does take the hydrocodone  at night and it does help him rest some.  He still wakes up throughout the night from pain. He does not take the medication the daytime. He had to call in sick from work 1 day last week due to the pain.  He got his x-ray done today, has not been read by  radiology yet.  PMP AWARE reviewed today: most recent rx for vicodin 5/325 was filled 02/04/24, # 30, rx by me. No red flags.   Past Medical History:  Diagnosis Date   Avascular necrosis of femoral head (HCC)    bilat, w/out collapse, on CT 2024   CAP (community acquired pneumonia)    Dizziness    MRI brain normal 04/2018.  06/2018 ENT eval by Dr. Deleta Felix, possible positional vertigo, no further w/u recommended since pt was improving.   Elevated PSA    mild 07/2023   Hypercholesterolemia 08/2018   recommended statin 08/2018   Metatarsal fracture    multiple, R foot   Tobacco dependence    current as of 08/2018    Past Surgical History:  Procedure Laterality Date   APPENDECTOMY     early 105s   CARDIAC CATHETERIZATION     [place]--approx in his early 30s --normal (for persistent CP).   COLONOSCOPY     04/2013 [facility]--normal screening.    Outpatient Medications Prior to Visit  Medication Sig Dispense Refill   HYDROcodone -acetaminophen  (NORCO/VICODIN) 5-325 MG tablet 1-2 tabs po bid as needed for shoulder pain 30 tablet 0   azelastine  (ASTELIN ) 0.1 % nasal spray 2 sprays each nostril q12h prn nasal congestion (Patient not taking: Reported on 07/14/2023) 30 mL 6   No facility-administered medications prior to visit.    Allergies  Allergen Reactions   Atorvastatin  Palpitations    Review of Systems As per HPI  PE:    02/26/2024  2:53 PM 02/04/2024    1:12 PM 07/17/2023    4:01 PM  Vitals with BMI  Height 5\' 11"  5\' 11"    Weight 203 lbs 3 oz 202 lbs 3 oz   BMI 28.35 28.21   Systolic 146 133 161  Diastolic 83 79 86  Pulse 86 85    Physical Exam  Gen: Alert, well appearing.  Patient is oriented to person, place, time, and situation. AFFECT: pleasant, lucid thought and speech. No further exam today  LABS:  None  IMPRESSION AND PLAN:  Chronic left shoulder pain.  He has rotator cuff tendinopathy with subacromial impingement  syndrome. X-ray of left shoulder appears normal to me today but needs over read by radiology. He declines steroid injection, states his sister had a bad experience with steroids and he wants to avoid them at all cost. He is willing to do physical therapy if his insurance covers it. If unable to participate in PT or if not improving with PT in 4 to 6 weeks then we will get him in to see orthopedics, possibly get left shoulder MRI. I did refill his Vicodin 5/325, 1-2 twice daily as needed, #30.  An After Visit Summary was printed and given to the patient.  FOLLOW UP: Return for 6-8 wks f/u left shoulder.  Signed:  Arletha Lady, MD           02/26/2024

## 2024-03-01 ENCOUNTER — Other Ambulatory Visit: Payer: Self-pay | Admitting: Family Medicine

## 2024-03-01 ENCOUNTER — Ambulatory Visit: Payer: Self-pay

## 2024-03-01 MED ORDER — HYDROCODONE-ACETAMINOPHEN 5-325 MG PO TABS: ORAL_TABLET | ORAL | 0 refills | Status: DC

## 2024-03-01 MED ORDER — HYDROCODONE-ACETAMINOPHEN 5-325 MG PO TABS: 1.0000 | ORAL_TABLET | Freq: Four times a day (QID) | ORAL | 0 refills | Status: DC | PRN

## 2024-03-01 NOTE — Telephone Encounter (Signed)
 Ok, vicodin 5/325 rx sent today

## 2024-03-01 NOTE — Telephone Encounter (Signed)
 Last refill sent 5/29 (30,0)  Please fill, if appropriate.

## 2024-03-01 NOTE — Telephone Encounter (Signed)
 Copied from CRM [id number]. Topic: Clinical - Medication Refill >> Mar 01, 2024  9:05 AM Clyde Darling P wrote: Medication: HYDROcodone -acetaminophen  (NORCO/VICODIN) 5-325 MG tablet  Has the patient contacted their pharmacy? Yes- advise there is no refills (Agent: If no, request that the patient contact the pharmacy for the refill. If patient does not wish to contact the pharmacy document the reason why and proceed with request.) (Agent: If yes, when and what did the pharmacy advise?)  This is the patient's preferred pharmacy:    [id number] - [place], [place] - [address] [place] [address] Phone: [phone] Fax: [phone]  Is this the correct pharmacy for this prescription? Yes If no, delete pharmacy and type the correct one.   Has the prescription been filled recently? No  Is the patient out of the medication? Yes- ran out 2 days ago  Has the patient been seen for an appointment in the last year OR does the patient have an upcoming appointment? Yes  Can we respond through MyChart? Yes  Agent: Please be advised that Rx refills may take up to 3 business days. We ask that you follow-up with your pharmacy.

## 2024-03-01 NOTE — Telephone Encounter (Signed)
 Pt states that he was "suppose to have my prescription already".  Pt states that he still does not have the rx's.   Chief Complaint: L shoulder Symptoms: pain Frequency: ongoing/chronic Pertinent Negatives: Patient denies new injury Disposition: [] ED /[] Urgent Care (no appt availability in office) / [] Appointment(In office/virtual)/ []  [facility]/ [] Home Care/ [] Refused Recommended Disposition /[] [place] [facility]/ [x]  Follow-up with PCP Additional Notes: known rotator cuff. Pt states that he was told last week that he would get pain medication. Pt has been going to work and still working which he believes has made the pain worse. Pt states that he would like to know why the pain meds have not been sent in.  Copied from CRM [id number]. Topic: Clinical - Red Word Triage >> Mar 01, 2024  3:51 PM [place] E wrote: [facility] that prompted transfer to Nurse Triage: Shoulder pain. Patient stated he is having left shoulder pain for 2 months. Rated the pain a level 8-9 out of 10. Reason for Disposition  [1] Unable to use arm at all AND [2] because of shoulder pain or stiffness  Answer Assessment - Initial Assessment Questions 1. ONSET: "When did the pain start?"     Ongoing, chronic 2. LOCATION: "Where is the pain located?"     L shoulder 3. PAIN: "How bad is the pain?" (Scale 1-10; or mild, moderate, severe)   - MILD (1-3): doesn't interfere with normal activities   - MODERATE (4-7): interferes with normal activities (e.g., work or school) or awakens from sleep   - SEVERE (8-10): excruciating pain, unable to do any normal activities, unable to move arm at all due to pain     9 4. WORK OR EXERCISE: "Has there been any recent work or exercise that involved this part of the body?"     denies 5. CAUSE: "What do you think is causing the shoulder pain?"     Known rotator cuff 6. OTHER SYMPTOMS: "Do you have any other symptoms?" (e.g., neck pain, swelling, rash, fever, numbness,  weakness) Spreading into the neck and back  Protocols used: Shoulder Pain-A-AH

## 2024-03-01 NOTE — Telephone Encounter (Signed)
 Rx sent 5/29. Pls check on this with the pharmacy

## 2024-03-01 NOTE — Telephone Encounter (Signed)
 Confirmed pharmacy did not receive 5/29 prescription

## 2024-03-03 ENCOUNTER — Telehealth: Payer: Self-pay

## 2024-03-03 MED ORDER — HYDROCODONE-ACETAMINOPHEN 5-325 MG PO TABS: 1.0000 | ORAL_TABLET | Freq: Four times a day (QID) | ORAL | 0 refills | Status: AC | PRN

## 2024-03-03 NOTE — Addendum Note (Signed)
 Addended by: Shelvia Dick on: 03/03/2024 11:59 AM   Modules accepted: Orders

## 2024-03-03 NOTE — Telephone Encounter (Signed)
 Tried calling patient, unable to LVM

## 2024-03-03 NOTE — Telephone Encounter (Signed)
 New prescription sent

## 2024-03-03 NOTE — Telephone Encounter (Signed)
 Copied from CRM [id number]. Topic: Clinical - Medication Question >> Mar 03, 2024  9:17 AM Allyne Areola wrote: Reason for CRM: Patient's HYDROcodone -acetaminophen  (NORCO/VICODIN) 5-325 MG tablet prescription was sent to the incorrect pharmacy. He no longer uses [facility] as they do not accept his insurance. Please [facility] the [facility] on patient's account.  Please resend to preferred pharmacy

## 2024-03-05 ENCOUNTER — Encounter: Payer: Self-pay | Admitting: Family Medicine

## 2024-03-05 ENCOUNTER — Ambulatory Visit: Payer: Self-pay | Admitting: Family Medicine

## 2024-03-05 NOTE — Telephone Encounter (Signed)
 Patient has picked up prescription, confirmed with patient.

## 2024-03-25 NOTE — Telephone Encounter (Signed)
 Communication  Reason for CRM: pt called in regarding lab results. I relayed info & he expressed understanding.     No further action needed at this time.

## 2024-06-04 ENCOUNTER — Ambulatory Visit: Payer: Self-pay

## 2024-06-04 ENCOUNTER — Ambulatory Visit
Admission: RE | Admit: 2024-06-04 | Discharge: 2024-06-04 | Disposition: A | Discharge status: Home / Self Care | Source: Ambulatory Visit | Attending: Family Medicine | Admitting: Family Medicine

## 2024-06-04 VITALS — BP 155/92 | HR 71 | Temp 97.9°F | Resp 16

## 2024-06-04 DIAGNOSIS — L02412 Cutaneous abscess of left axilla: Secondary | ICD-10-CM

## 2024-06-04 MED ORDER — DOXYCYCLINE HYCLATE 100 MG PO CAPS: 100.0000 mg | ORAL_CAPSULE | Freq: Two times a day (BID) | ORAL | 0 refills | Status: DC

## 2024-06-04 NOTE — ED Triage Notes (Signed)
 Pt present with c/o bumps under his lt arm x one month. States the bumps have progressed. States he has pain and discomfort.

## 2024-06-04 NOTE — Telephone Encounter (Signed)
 FYI Only or Action Required?: FYI only for provider.  Patient was last seen in primary care on 02/26/2024 by McGowen, Aleene DEL, MD.  Called Nurse Triage reporting [place].  Symptoms began several weeks ago.  Interventions attempted: Nothing.  Symptoms are: gradually worsening.  Triage Disposition: See Physician Within 24 Hours  Patient/caregiver understands and will follow disposition?: Yes   Copied from CRM [id number]. Topic: Clinical - Red Word Triage >> Jun 04, 2024  9:15 AM Rosina BIRCH wrote: Reason for RMF:qpcz knots under left armpit causing him pain Reason for Disposition  [1] Swelling is painful to touch AND [2] no fever  Answer Assessment - Initial Assessment Questions Additional info: No appointments available, proceeding to urgent care.    1. APPEARANCE of SWELLING: What does it look like?     5 knots  2. SIZE: How large is the swelling? (e.g., inches, cm; or compare to size of pinhead, tip of pen, eraser, coin, pea, grape, ping pong ball)      Tip of finger. Was soft now hard  3. LOCATION: Where is the swelling located?     Left axilla 4. ONSET: When did the swelling start?     3 weeks  5. COLOR: What color is it? Is there more than one color?     reddish 6. PAIN: Is there any pain? If Yes, ask: How bad is the pain? (Scale 1-10; or mild, moderate, severe)       mild 7. ITCH: Does it itch? If Yes, ask: How bad is the itch?      denies 8. CAUSE: What do you think caused the swelling?     unsure 9 OTHER SYMPTOMS: Do you have any other symptoms? (e.g., fever)     denies  Protocols used: Skin Lump or Localized Swelling-A-AH

## 2024-06-04 NOTE — Telephone Encounter (Signed)
Pt currently at urgent care

## 2024-06-04 NOTE — ED Provider Notes (Signed)
 [facility]    CSN: [id number] Arrival date & time: 06/04/24  1356      History   Chief Complaint Chief Complaint  Patient presents with   Abscess    Entered by patient    HPI Roger Carrillo is a 63 y.o. male presents for bumps under his arm.  Patient reports he had an x-ray of his shoulder a month ago to rule out rotator cuff issues and since then states has been having bumps pop up underneath his armpit that are painful.  He tried to drain  one with a needle but it only bled.  No fevers or chills.  No history of MRSA.  Denies history of hidradenitis suppurativa.  No OTC medications have been used since onset.  No other concerns at this time.   Abscess   Past Medical History:  Diagnosis Date   AC (acromioclavicular) arthritis    Left, 2025   Avascular necrosis of femoral head (HCC)    bilat, w/out collapse, on CT 2024   CAP (community acquired pneumonia)    Dizziness    MRI brain normal 04/2018.  06/2018 ENT eval by Dr. Debborah, possible positional vertigo, no further w/u recommended since pt was improving.   Elevated PSA    mild 07/2023   Hypercholesterolemia 08/2018   recommended statin 08/2018   Metatarsal fracture    multiple, R foot   Tobacco dependence    current as of 08/2018    Patient Active Problem List   Diagnosis Date Noted   Vertigo 10/18/2021    Past Surgical History:  Procedure Laterality Date   APPENDECTOMY     early 87s   CARDIAC CATHETERIZATION     [place]approx in his early 37s --normal (for persistent CP).   COLONOSCOPY     04/2013 [facility]--normal screening.       Home Medications    Prior to Admission medications   Medication Sig Start Date End Date Taking? Authorizing Provider  doxycycline  (VIBRAMYCIN ) 100 MG capsule Take 1 capsule (100 mg total) by mouth 2 (two) times daily. 06/04/24  Yes Kyrese Gartman, Jodi R, NP  azelastine  (ASTELIN ) 0.1 % nasal spray 2 sprays each nostril q12h prn nasal  congestion Patient not taking: Reported on 07/14/2023 11/09/21   McGowen, Aleene DEL, MD  HYDROcodone -acetaminophen  (NORCO/VICODIN) 5-325 MG tablet Take 1 tablet by mouth every 6 (six) hours as needed for moderate pain (pain score 4-6). 03/03/24   McGowen, Aleene DEL, MD    Family History Family History  Problem Relation Age of Onset   Diabetes Mother    Hypertension Mother    Cancer Sister     Social History Social History   Tobacco Use   Smoking status: Every Day    Current packs/day: 0.80    Average packs/day: 0.8 packs/day for 38.0 years (30.4 ttl pk-yrs)    Types: Cigarettes   Smokeless tobacco: Never  Vaping Use   Vaping status: Never Used  Substance Use Topics   Alcohol use: Yes    Comment: rarely   Drug use: No     Allergies   Atorvastatin    Review of Systems Review of Systems  Skin:        Bumps under axilla     Physical Exam Triage Vital Signs ED Triage Vitals  Encounter Vitals Group     BP 06/04/24 1543 (!) 155/92     Girls Systolic BP Percentile --      Girls Diastolic BP Percentile --  Boys Systolic BP Percentile --      Boys Diastolic BP Percentile --      Pulse Rate 06/04/24 1543 71     Resp 06/04/24 1543 16     Temp 06/04/24 1543 97.9 F (36.6 C)     Temp Source 06/04/24 1543 Oral     SpO2 06/04/24 1543 97 %     Weight --      Height --      Head Circumference --      Peak Flow --      Pain Score 06/04/24 1542 4     Pain Loc --      Pain Education --      Exclude from Growth Chart --    No data found.  Updated Vital Signs BP (!) 155/92 (BP Location: Left Arm)   Pulse 71   Temp 97.9 F (36.6 C) (Oral)   Resp 16   SpO2 97%   Visual Acuity Right Eye Distance:   Left Eye Distance:   Bilateral Distance:    Right Eye Near:   Left Eye Near:    Bilateral Near:     Physical Exam Vitals and nursing note reviewed.  Constitutional:      General: He is not in acute distress.    Appearance: Normal appearance. He is not  ill-appearing.  HENT:     Head: Normocephalic and atraumatic.  Eyes:     Pupils: Pupils are equal, round, and reactive to light.  Cardiovascular:     Rate and Rhythm: Normal rate.  Pulmonary:     Effort: Pulmonary effort is normal.  Skin:    General: Skin is warm and dry.     Comments: There are 2 indurated nonfluctuant tender abscesses to the left axilla.  Mild erythema without active drainage.  Neurological:     General: No focal deficit present.     Mental Status: He is alert and oriented to person, place, and time.  Psychiatric:        Mood and Affect: Mood normal.        Behavior: Behavior normal.      UC Treatments / Results  Labs (all labs ordered are listed, but only abnormal results are displayed) Labs Reviewed - No data to display  EKG   Radiology No results found.  Procedures Procedures (including critical care time)  Medications Ordered in UC Medications - No data to display  Initial Impression / Assessment and Plan / UC Course  I have reviewed the triage vital signs and the nursing notes.  Pertinent labs & imaging results that were available during my care of the patient were reviewed by me and considered in my medical decision making (see chart for details).     Reviewed exam and symptoms with patient.  No red flags.  Will start doxycycline  twice daily for 10 days and advise warm compresses.  No indication for I&D at this time.  PCP follow-up if symptoms do not improve.  ER precautions reviewed Final Clinical Impressions(s) / UC Diagnoses   Final diagnoses:  Abscess of left axilla     Discharge Instructions      Start doxycycline  twice daily for 10 days.  Warm compresses to the area as needed.  Please follow-up with your PCP if your symptoms do not improve.  Please go to the ER if you develop any worsening symptoms.  Hope you feel better soon!    ED Prescriptions     Medication Sig Dispense Auth.  Provider   doxycycline  (VIBRAMYCIN ) 100 MG  capsule Take 1 capsule (100 mg total) by mouth 2 (two) times daily. 20 capsule Latresa Gasser, Jodi R, NP      PDMP not reviewed this encounter.   [name], NP 06/04/24 [phone]

## 2024-06-04 NOTE — Discharge Instructions (Addendum)
 Start doxycycline  twice daily for 10 days.  Warm compresses to the area as needed.  Please follow-up with your PCP if your symptoms do not improve.  Please go to the ER if you develop any worsening symptoms.  Hope you feel better soon!

## 2024-07-30 ENCOUNTER — Encounter: Payer: Self-pay | Admitting: Family Medicine

## 2024-07-30 ENCOUNTER — Ambulatory Visit: Admitting: Family Medicine

## 2024-07-30 VITALS — BP 136/83 | HR 80 | Temp 98.2°F | Ht 71.0 in | Wt 212.0 lb

## 2024-07-30 DIAGNOSIS — Z87891 Personal history of nicotine dependence: Secondary | ICD-10-CM | POA: Diagnosis not present

## 2024-07-30 DIAGNOSIS — Z125 Encounter for screening for malignant neoplasm of prostate: Secondary | ICD-10-CM

## 2024-07-30 DIAGNOSIS — E782 Mixed hyperlipidemia: Secondary | ICD-10-CM

## 2024-07-30 DIAGNOSIS — Z122 Encounter for screening for malignant neoplasm of respiratory organs: Secondary | ICD-10-CM

## 2024-07-30 DIAGNOSIS — Z0000 Encounter for general adult medical examination without abnormal findings: Secondary | ICD-10-CM

## 2024-07-30 DIAGNOSIS — E875 Hyperkalemia: Secondary | ICD-10-CM

## 2024-07-30 DIAGNOSIS — F5104 Psychophysiologic insomnia: Secondary | ICD-10-CM

## 2024-07-30 DIAGNOSIS — M25512 Pain in left shoulder: Secondary | ICD-10-CM

## 2024-07-30 DIAGNOSIS — G8929 Other chronic pain: Secondary | ICD-10-CM

## 2024-07-30 DIAGNOSIS — M19012 Primary osteoarthritis, left shoulder: Secondary | ICD-10-CM

## 2024-07-30 DIAGNOSIS — L02223 Furuncle of chest wall: Secondary | ICD-10-CM

## 2024-07-30 DIAGNOSIS — R972 Elevated prostate specific antigen [PSA]: Secondary | ICD-10-CM | POA: Diagnosis not present

## 2024-07-30 LAB — CBC WITH DIFFERENTIAL/PLATELET
Basophils Absolute: 0.1 K/uL (ref 0.0–0.1)
Basophils Relative: 1.1 % (ref 0.0–3.0)
Eosinophils Absolute: 0.1 K/uL (ref 0.0–0.7)
Eosinophils Relative: 1.8 % (ref 0.0–5.0)
HCT: 47.4 % (ref 39.0–52.0)
Hemoglobin: 15.8 g/dL (ref 13.0–17.0)
Lymphocytes Relative: 38.6 % (ref 12.0–46.0)
Lymphs Abs: 2.7 K/uL (ref 0.7–4.0)
MCHC: 33.3 g/dL (ref 30.0–36.0)
MCV: 91.2 fl (ref 78.0–100.0)
Monocytes Absolute: 0.6 K/uL (ref 0.1–1.0)
Monocytes Relative: 8.9 % (ref 3.0–12.0)
Neutro Abs: 3.4 K/uL (ref 1.4–7.7)
Neutrophils Relative %: 49.6 % (ref 43.0–77.0)
Platelets: 289 K/uL (ref 150.0–400.0)
RBC: 5.19 Mil/uL (ref 4.22–5.81)
RDW: 15.1 % (ref 11.5–15.5)
WBC: 6.9 K/uL (ref 4.0–10.5)

## 2024-07-30 LAB — LIPID PANEL
Cholesterol: 203 mg/dL — ABNORMAL HIGH (ref 0–200)
HDL: 49.2 mg/dL (ref >39.00)
LDL Cholesterol: 127 mg/dL — ABNORMAL HIGH (ref 0–99)
NonHDL: 153.51
Total CHOL/HDL Ratio: 4
Triglycerides: 134 mg/dL (ref 0.0–149.0)
VLDL: 26.8 mg/dL (ref 0.0–40.0)

## 2024-07-30 LAB — COMPREHENSIVE METABOLIC PANEL WITH GFR
ALT: 12 U/L (ref 0–53)
AST: 16 U/L (ref 0–37)
Albumin: 4.6 g/dL (ref 3.5–5.2)
Alkaline Phosphatase: 67 U/L (ref 39–117)
BUN: 11 mg/dL (ref 6–23)
CO2: 29 meq/L (ref 19–32)
Calcium: 9.6 mg/dL (ref 8.4–10.5)
Chloride: 102 meq/L (ref 96–112)
Creatinine, Ser: 1.11 mg/dL (ref 0.40–1.50)
GFR: 70.17 mL/min (ref >60.00)
Glucose, Bld: 85 mg/dL (ref 70–99)
Potassium: 4.5 meq/L (ref 3.5–5.1)
Sodium: 139 meq/L (ref 135–145)
Total Bilirubin: 0.6 mg/dL (ref 0.2–1.2)
Total Protein: 7.6 g/dL (ref 6.0–8.3)

## 2024-07-30 LAB — PSA: PSA: 3.12 ng/mL (ref 0.10–4.00)

## 2024-07-30 LAB — HEMOGLOBIN A1C: Hgb A1c MFr Bld: 6.2 % (ref 4.6–6.5)

## 2024-07-30 MED ORDER — DOXYCYCLINE HYCLATE 100 MG PO CAPS: 100.0000 mg | ORAL_CAPSULE | Freq: Two times a day (BID) | ORAL | 0 refills | Status: AC

## 2024-07-30 MED ORDER — TRAZODONE HCL 50 MG PO TABS: ORAL_TABLET | ORAL | 1 refills | Status: AC

## 2024-07-30 NOTE — Progress Notes (Signed)
 OFFICE VISIT  07/30/2024  CC:  Chief Complaint  Patient presents with   Medical Management of Chronic Issues    Patient is a 63 y.o. male who presents for annual health maintenance exam  INTERIM HX: Yesterday he noticed a painful bump on the right anterior chest wall lateral to the nipple.  It has grown significantly over the last 24 hours and is now about the size of a quarter and red.  He had 2 similar spots in the left axilla about 2 months ago.  He was treated at an urgent care with doxycycline .  These areas have resolved now.  He has chronic left shoulder pain that is moderate to severe in nature and is impairing sleep because he cannot lay on his left shoulder. He also has trouble shutting his mind down at night.  He is interested in medication for insomnia but does not want to be anything that is potentially habit-forming.  Past Medical History:  Diagnosis Date   AC (acromioclavicular) arthritis    Left, 2025   Avascular necrosis of femoral head (HCC)    bilat, w/out collapse, on CT 2024   CAP (community acquired pneumonia)    Dizziness    MRI brain normal 04/2018.  06/2018 ENT eval by Dr. Debborah, possible positional vertigo, no further w/u recommended since pt was improving.   Elevated PSA    mild 07/2023   Hypercholesterolemia 08/2018   recommended statin 08/2018   Metatarsal fracture    multiple, R foot   Tobacco dependence    current as of 08/2018    Past Surgical History:  Procedure Laterality Date   APPENDECTOMY     early 22s   CARDIAC CATHETERIZATION     [place]--approx in his early 30s --normal (for persistent CP).   COLONOSCOPY     04/2013 [facility]--normal screening.   Family History  Problem Relation Age of Onset   Diabetes Mother    Hypertension Mother    Cancer Sister    Social History   Socioeconomic History   Marital status: Legally Separated    Spouse name: Not on file   Number of children: Not on file   Years of  education: Not on file   Highest education level: Not on file  Occupational History   Not on file  Tobacco Use   Smoking status: Every Day    Current packs/day: 0.80    Average packs/day: 0.8 packs/day for 38.0 years (30.4 ttl pk-yrs)    Types: Cigarettes   Smokeless tobacco: Never  Vaping Use   Vaping status: Never Used  Substance and Sexual Activity   Alcohol use: Yes    Comment: rarely   Drug use: No   Sexual activity: Not on file  Other Topics Concern   Not on file  Social History Narrative   Married, separated 2019.  No children.   Educ: comm college   Occup: driver, systems analyst to job sites.   Alcohol: 4-5 beers per week.   Tobacco: 55 pack yr hx--current as of 05/2018.   Drugs: occ marijuana, otherwise none.   Social Drivers of Corporate Investment Banker Strain: Not on file  Food Insecurity: Not on file  Transportation Needs: Not on file  Physical Activity: Not on file  Stress: Not on file  Social Connections: Not on file   Outpatient Medications Prior to Visit  Medication Sig Dispense Refill   HYDROcodone -acetaminophen  (NORCO/VICODIN) 5-325 MG tablet Take 1 tablet by mouth every 6 (six)  hours as needed for moderate pain (pain score 4-6). (Patient not taking: Reported on 07/30/2024) 30 tablet 0   azelastine  (ASTELIN ) 0.1 % nasal spray 2 sprays each nostril q12h prn nasal congestion (Patient not taking: Reported on 07/30/2024) 30 mL 6   doxycycline  (VIBRAMYCIN ) 100 MG capsule Take 1 capsule (100 mg total) by mouth 2 (two) times daily. (Patient not taking: Reported on 07/30/2024) 20 capsule 0   No facility-administered medications prior to visit.    Allergies  Allergen Reactions   Atorvastatin  Palpitations    Review of Systems  Constitutional:  Negative for appetite change, chills, fatigue and fever.  HENT:  Negative for congestion, dental problem, ear pain and sore throat.   Eyes:  Negative for discharge, redness and visual disturbance.  Respiratory:   Negative for cough, chest tightness, shortness of breath and wheezing.   Cardiovascular:  Negative for chest pain, palpitations and leg swelling.  Gastrointestinal:  Negative for abdominal pain, blood in stool, diarrhea, nausea and vomiting.  Genitourinary:  Negative for difficulty urinating, dysuria, flank pain, frequency, hematuria and urgency.  Musculoskeletal:  Positive for arthralgias (L shoulder). Negative for back pain, joint swelling, myalgias and neck stiffness.  Skin:  Negative for pallor and rash.       R pectoral focal, painful swelling   Neurological:  Negative for dizziness, speech difficulty, weakness and headaches.  Hematological:  Negative for adenopathy. Does not bruise/bleed easily.  Psychiatric/Behavioral:  Negative for confusion and sleep disturbance. The patient is not nervous/anxious.     PE:    07/30/2024   10:24 AM 07/30/2024   10:18 AM 06/04/2024    3:43 PM  Vitals with BMI  Height  5' 11   Weight  212 lbs   BMI  29.58   Systolic 136 153 844  Diastolic 83 83 92  Pulse  80 71     Physical Exam  Gen: Alert, well appearing.  Patient is oriented to person, place, time, and situation. AFFECT: pleasant, lucid thought and speech. ENT: Ears: EACs clear, normal epithelium.  TMs with good light reflex and landmarks bilaterally.  Eyes: no injection, icteris, swelling, or exudate.  EOMI, PERRLA. Nose: no drainage or turbinate edema/swelling.  No injection or focal lesion.  Mouth: lips without lesion/swelling.  Oral mucosa pink and moist.  Dentition intact and without obvious caries or gingival swelling.  Oropharynx without erythema, exudate, or swelling.  Neck: supple/nontender.  No LAD, mass, or TM.  Carotid pulses 2+ bilaterally, without bruits. CV: RRR, no m/r/g.   LUNGS: CTA bilat, nonlabored resps, good aeration in all lung fields. ABD: soft, NT, ND, BS normal.  No hepatospenomegaly or mass.  No bruits. EXT: no clubbing, cyanosis, or edema.  Musculoskeletal:  no joint swelling, erythema, warmth, or tenderness.  ROM of all joints intact. Skin -right pectoral region (a couple inches lateral to the nipple) with a half dollar sized erythematous region with fairly well demarcated borders, very tender to touch.  Minimal induration but I do not feel any significant subcu extension or fluctuance.  Nipple normal. Left axillae w/out erythema or TTP.  A couple of BB sized subQ nodules are palpable where he had previous furuncles.  LABS:  Last CBC Lab Results  Component Value Date   WBC 6.5 07/14/2023   HGB 16.6 07/14/2023   HCT 51.1 07/14/2023   MCV 97.0 07/14/2023   MCH 29.3 05/13/2018   RDW 14.2 07/14/2023   PLT 270.0 07/14/2023   Last metabolic panel Lab Results  Component  Value Date   GLUCOSE 97 07/14/2023   NA 136 07/14/2023   K 5.3 (H) 07/14/2023   CL 101 07/14/2023   CO2 27 07/14/2023   BUN 9 07/14/2023   CREATININE 1.04 07/14/2023   GFR 76.43 07/14/2023   CALCIUM  10.0 07/14/2023   PROT 8.0 07/14/2023   ALBUMIN 4.6 07/14/2023   BILITOT 0.7 07/14/2023   ALKPHOS 52 07/14/2023   AST 25 07/14/2023   ALT 17 07/14/2023   ANIONGAP 11 05/13/2018   Last lipids Lab Results  Component Value Date   CHOL 211 (H) 07/14/2023   HDL 70.30 07/14/2023   LDLCALC 106 (H) 07/14/2023   TRIG 174.0 (H) 07/14/2023   CHOLHDL 3 07/14/2023   Last thyroid functions Lab Results  Component Value Date   TSH 1.52 09/09/2018   Lab Results  Component Value Date   PSA 2.96 02/04/2024   PSA 4.17 (H) 07/14/2023   PSA 1.65 09/09/2018   IMPRESSION AND PLAN:  #1 health maintenance exam: Reviewed age and gender appropriate health maintenance issues (prudent diet, regular exercise, health risks of tobacco and excessive alcohol, use of seatbelts, fire alarms in home, use of sunscreen).  Also reviewed age and gender appropriate health screening as well as vaccine recommendations. Vaccines: flu vaccine--> pt declined.  Prevnar--> declined.   Shingrix  vaccine-->pt declined. Labs: fasting HP + PSA. Prostate ca screening: PSA. Colon ca screening: next colonoscopy due 2026. Lung cancer screening: CT of chest December 2024 showed no pulmonary abnormality. Ordered lung cancer screening CT today to be scheduled in December or January.  #2 Furuncle, right pectoral region. At this time it does not feel like anything that can be incised and drained. Doxycycline  100 mg twice daily x 10 days prescribed today.  #3 insomnia, partially due to chronic left shoulder pain. Will do trial of trazodone 50 mg, 1-2 nightly as needed.  4.  Chronic left shoulder pain. Radiograph 02/26/2024 showed mild AC degenerative changes. His exam does indicate that the [facility] joint is a source of his pain. He has not wanted PT in the past and does not want to be on any opioid pain medication. NSAID has not really helped. He is open to a trial of cortisone injection.  He will make a follow-up appointment to get this.  An After Visit Summary was printed and given to the patient.  FOLLOW UP: Return for appt at your convenience for left shoulder cortisone injection.  Signed:  Gerlene Hockey, MD           07/30/2024

## 2024-07-30 NOTE — Patient Instructions (Signed)
 Health Maintenance, Male  Adopting a healthy lifestyle and getting preventive care are important in promoting health and wellness. Ask your health care provider about:  The right schedule for you to have regular tests and exams.  Things you can do on your own to prevent diseases and keep yourself healthy.  What should I know about diet, weight, and exercise?  Eat a healthy diet    Eat a diet that includes plenty of vegetables, fruits, low-fat dairy products, and lean protein.  Do not eat a lot of foods that are high in solid fats, added sugars, or sodium.  Maintain a healthy weight  Body mass index (BMI) is a measurement that can be used to identify possible weight problems. It estimates body fat based on height and weight. Your health care provider can help determine your BMI and help you achieve or maintain a healthy weight.  Get regular exercise  Get regular exercise. This is one of the most important things you can do for your health. Most adults should:  Exercise for at least 150 minutes each week. The exercise should increase your heart rate and make you sweat (moderate-intensity exercise).  Do strengthening exercises at least twice a week. This is in addition to the moderate-intensity exercise.  Spend less time sitting. Even light physical activity can be beneficial.  Watch cholesterol and blood lipids  Have your blood tested for lipids and cholesterol at 64 years of age, then have this test every 5 years.  You may need to have your cholesterol levels checked more often if:  Your lipid or cholesterol levels are high.  You are older than 64 years of age.  You are at high risk for heart disease.  What should I know about cancer screening?  Many types of cancers can be detected early and may often be prevented. Depending on your health history and family history, you may need to have cancer screening at various ages. This may include screening for:  Colorectal cancer.  Prostate cancer.  Skin cancer.  Lung  cancer.  What should I know about heart disease, diabetes, and high blood pressure?  Blood pressure and heart disease  High blood pressure causes heart disease and increases the risk of stroke. This is more likely to develop in people who have high blood pressure readings or are overweight.  Talk with your health care provider about your target blood pressure readings.  Have your blood pressure checked:  Every 3-5 years if you are 24-52 years of age.  Every year if you are 3 years old or older.  If you are between the ages of 60 and 72 and are a current or former smoker, ask your health care provider if you should have a one-time screening for abdominal aortic aneurysm (AAA).  Diabetes  Have regular diabetes screenings. This checks your fasting blood sugar level. Have the screening done:  Once every three years after age 66 if you are at a normal weight and have a low risk for diabetes.  More often and at a younger age if you are overweight or have a high risk for diabetes.  What should I know about preventing infection?  Hepatitis B  If you have a higher risk for hepatitis B, you should be screened for this virus. Talk with your health care provider to find out if you are at risk for hepatitis B infection.  Hepatitis C  Blood testing is recommended for:  Everyone born from [birth year] through 1966.  Anyone  with known risk factors for hepatitis C.  Sexually transmitted infections (STIs)  You should be screened each year for STIs, including gonorrhea and chlamydia, if:  You are sexually active and are younger than 64 years of age.  You are older than 64 years of age and your health care provider tells you that you are at risk for this type of infection.  Your sexual activity has changed since you were last screened, and you are at increased risk for chlamydia or gonorrhea. Ask your health care provider if you are at risk.  Ask your health care provider about whether you are at high risk for HIV. Your health care provider  may recommend a prescription medicine to help prevent HIV infection. If you choose to take medicine to prevent HIV, you should first get tested for HIV. You should then be tested every 3 months for as long as you are taking the medicine.  Follow these instructions at home:  Alcohol use  Do not drink alcohol if your health care provider tells you not to drink.  If you drink alcohol:  Limit how much you have to 0-2 drinks a day.  Know how much alcohol is in your drink. In the [place] one drink equals one 12 oz bottle of beer (355 mL), one 5 oz glass of wine (148 mL), or one 1 oz glass of hard liquor (44 mL).  Lifestyle  Do not use any products that contain nicotine or tobacco. These products include cigarettes, chewing tobacco, and vaping devices, such as e-cigarettes. If you need help quitting, ask your health care provider.  Do not use street drugs.  Do not share needles.  Ask your health care provider for help if you need support or information about quitting drugs.  General instructions  Schedule regular health, dental, and eye exams.  Stay current with your vaccines.  Tell your health care provider if:  You often feel depressed.  You have ever been abused or do not feel safe at home.  Summary  Adopting a healthy lifestyle and getting preventive care are important in promoting health and wellness.  Follow your health care provider's instructions about healthy diet, exercising, and getting tested or screened for diseases.  Follow your health care provider's instructions on monitoring your cholesterol and blood pressure.  This information is not intended to replace advice given to you by your health care provider. Make sure you discuss any questions you have with your health care provider.  Document Revised: 02/05/2021 Document Reviewed: 02/05/2021  [employer]vier Patient Education  2024 [employer].

## 2024-08-02 ENCOUNTER — Ambulatory Visit: Payer: Self-pay | Admitting: Family Medicine
